# Patient Record
Sex: Male | Born: 1970 | Race: Black or African American | Hispanic: No | Smoking: Never smoker
Health system: Southern US, Community
[De-identification: ages and names within clinical notes are randomized; demographics above are authoritative.]

## PROBLEM LIST (undated history)

## (undated) DIAGNOSIS — I10 Essential (primary) hypertension: Secondary | ICD-10-CM

## (undated) DIAGNOSIS — K219 Gastro-esophageal reflux disease without esophagitis: Secondary | ICD-10-CM

## (undated) DIAGNOSIS — E785 Hyperlipidemia, unspecified: Secondary | ICD-10-CM

## (undated) DIAGNOSIS — E669 Obesity, unspecified: Secondary | ICD-10-CM

## (undated) DIAGNOSIS — I214 Non-ST elevation (NSTEMI) myocardial infarction: Secondary | ICD-10-CM

## (undated) HISTORY — PX: CARDIAC CATHETERIZATION: SHX172

---

## 2006-02-18 ENCOUNTER — Emergency Department: Payer: Self-pay | Admitting: Internal Medicine

## 2009-06-06 ENCOUNTER — Emergency Department: Payer: Self-pay | Admitting: Emergency Medicine

## 2011-02-19 ENCOUNTER — Ambulatory Visit: Payer: Self-pay | Admitting: Unknown Physician Specialty

## 2013-01-16 ENCOUNTER — Emergency Department: Payer: Self-pay | Admitting: Emergency Medicine

## 2014-05-16 ENCOUNTER — Ambulatory Visit: Payer: Self-pay | Admitting: Internal Medicine

## 2015-01-15 ENCOUNTER — Inpatient Hospital Stay: Payer: Self-pay | Admitting: Internal Medicine

## 2015-03-11 ENCOUNTER — Encounter
Admit: 2015-03-11 | Disposition: A | Discharge status: Home / Self Care | Payer: Self-pay | Attending: Internal Medicine | Admitting: Internal Medicine

## 2015-03-30 NOTE — H&P (Signed)
PATIENT NAME:  Carlos BurrowRICHMOND, Carlos Bryant MR#:  [id number] DATE OF BIRTH:  [birth year]  DATE OF ADMISSION:  01/15/2015  PRIMARY CARE PHYSICIAN: [name], MD with [facility].  CHIEF COMPLAINT: Chest pain.   HISTORY OF PRESENTING ILLNESS: A 44 year old African American male patient with history of hypertension, presents to the Emergency Room complaining of acute onset of chest pain, radiating to the back earlier today. The patient was initially seen with an EKG by EMS, thought to have NSTEMI, brought to the Emergency Room. Here, he did have mild elevation in lead III and aVF. Dr. Darrold JunkerParaschos with cardiology was called urgently, who thought the patient is having an NON- STEMI not an ST elevation MI, and is being admitted to the hospitalist service, being critically ill with MI.   He mentions that this pain started slowly 2 days prior, acutely worsened today morning. He has had some nausea, sweating, shortness of breath, but no vomiting, abdominal pain. Never had similar pain, never had a stress test. He mentions he saw his primary care physician about 2 months prior, had a new medication added, and his blood pressure was elevated at that time. Also, his blood pressure is increased today in the range of 170s to 180s systolic.   PAST MEDICAL HISTORY: Hypertension.   FAMILY HISTORY: Kidney transplant in his mother. Premature CAD in his sister, who died of an MI in her 9040s.   SOCIAL HISTORY: The patient does not smoke. Occasional alcohol use. Works for [employer]. No illicit drug use.   ALLERGIES: No known drug allergies.   REVIEW OF SYSTEMS: CONSTITUTIONAL: No fever, fatigue.  EYES: No blurred vision, pain or redness.  EARS, NOSE, AND THROAT: No tinnitus, ear pain, or hearing loss.  RESPIRATORY: No cough, wheeze, hemoptysis.  CARDIOVASCULAR: Has chest pain.  GASTROINTESTINAL: No nausea, vomiting, diarrhea, abdominal pain.  GENITOURINARY: No dysuria, hematuria, or frequency.   ENDOCRINE: No polyuria, nocturia, thyroid problems.  HEMATOLOGIC AND LYMPHATIC: No anemia, easy bruising, bleeding.  INTEGUMENTARY: No acne, rash, lesion.  MUSCULOSKELETAL: No back pain or arthritis.  NEUROLOGIC: No focal numbness, weakness.  PSYCHIATRIC: No anxiety or depression.   HOME MEDICATIONS: 1. Amlodipine 10 mg daily.  2. Losartan 100 mg daily.  3. Multivitamin 1 tablet daily.  4. Hydrochlorothiazide 25 mg oral once a day.   PHYSICAL EXAMINATION: VITAL SIGNS: Temperature 97.9, pulse of 76, blood pressure 170/113, saturating 100% on oxygen.  GENERAL: Obese African American male patient lying in bed, in mild distress secondary to his chest pain.  PSYCHIATRIC: Alert and oriented x 3. Mood and affect appropriate. Judgment intact.  HEENT: Normocephalic, atraumatic. Oral mucosa moist and pink. External ears and nose normal. No pallor or icterus. Pupils bilaterally equal and reactive to light.  NECK: Supple. No thyromegaly. No palpable lymph nodes. Trachea midline. No bruits or JVD.  CARDIOVASCULAR: S1, S2, without any murmurs. Peripheral pulses 2+. No edema.  RESPIRATORY: Normal work of breathing. Clear to auscultation on both sides.  GASTROINTESTINAL: Soft abdomen, nontender. Bowel sounds present. No hepatosplenomegaly palpable.  SKIN: Warm and dry. No petechiae, rash, ulcers.  MUSCULOSKELETAL: No joint swelling, redness, effusion of the large joints. Normal muscle tone.  NEUROLOGICAL: Motor strength 5/5 in upper and lower extremities. Sensation  intact all over.  LYMPHATICS: No cervical lymphadenopathy.   LABORATORY STUDIES AND IMAGING STUDIES: CTA of the chest showed no dissection, no pulmonary embolism. Glucose of 113, BUN 11, creatinine 1.30, sodium 140, potassium 3.7. AST, ALT, alkaline phosphatase, and bilirubin normal.  CK 348. Troponin 0.09. WBC 5.9, hemoglobin 15, platelets of 248,000, MCV 83. INR 0.9. EKG shows normal sinus rhythm with ST elevation in aVF and lead III with  mild depression in aVL area.   ASSESSMENT AND PLAN: 1. Non ST-segment myocardial infarction. The patient with risk factors with uncontrolled hypertension and premature coronary artery disease in the family. Presently, the patient has been seen by cardiology. We will admit the patient on to be telemetry floor, get 2 more sets of cardiac enzymes. Place on nitroglycerin paste. His chest pain is almost resolved at this point. We will get an echocardiogram, and the patient will need a cardiac catheterization per cardiology. We will also had aspirin, statin. We will add metoprolol 50 b.i.d., which will help with his coronary artery disease, and uncontrolled blood pressure.  2. Uncontrolled hypertension. Continue home medications. Add metoprolol b.i.d.  3. Deep vein thrombosis prophylaxis. The patient is on a heparin drip.   CODE STATUS: Full code.   CRITICAL CARE TIME SPENT ON THIS PATIENT: 40 minutes.    ____________________________ Molinda Bailiff Zoha Spranger, MD srs:mw D: 01/15/2015 14:05:53 ET T: 01/15/2015 15:17:35 ET JOB#: [id number]  cc: Wardell Heath R. Manan Olmo, MD, <Dictator> John B. Danne Harbor, MD Wardell Heath [place] B[place] MD ELECTRONICALLY SIGNED 01/16/2015 16:28

## 2015-03-30 NOTE — Consult Note (Signed)
PATIENT NAME:  Carlos Bryant, Carlos Bryant MR#:  [id number] DATE OF BIRTH:  1971/05/28  DATE OF CONSULTATION:  01/15/2015  REFERRING PHYSICIAN:   CONSULTING PHYSICIAN:  Marcina MillardAlexander Mareesa Gathright, MD  PRIMARY CARE PHYSICIAN: [name], MD   CHIEF COMPLAINT: Chest pain.   HISTORY OF PRESENT ILLNESS: The patient is a 44 year old gentleman with history of hypertension. The patient reports that he was in his usual state of health until 1-2 days prior to admission, when he has had intermittent episodes of chest discomfort. The patient reports substernal chest discomfort, particularly when takes a deep breath in, which radiates to his mid back. The patient was brought in via EMS to [facility]. EKG did reveal ST elevation in inferior leads and STEMI protocol was called. After arrival to the Emergency Room, chest pain syndrome improved to 2 to 3/10. Review of the EKG showed the EKG was nondiagnostic for ST elevation myocardial infarction. Initial troponin was borderline at 0.09. CT scan of the chest was performed, which did not reveal evidence for pulmonary embolus. The patient was hypertensive with a blood pressure 170/100.   PAST MEDICAL HISTORY: Hypertension.   MEDICATIONS: Amlodipine 10 mg daily, losartan 100 mg daily, hydrochlorothiazide 25 mg daily, multivitamin 1 daily.  SOCIAL HISTORY: The patient is married. He denies tobacco abuse.  FAMILY HISTORY: No immediate family history of coronary artery disease or myocardial infarction.   REVIEW OF SYSTEMS:  CONSTITUTIONAL: No fever or chills.  EYES: No blurry vision.  EARS: No hearing loss.  RESPIRATORY: No shortness of breath.  CARDIOVASCULAR: The patient does complain of chest pain, which radiates to his mid back, particularly upon deep breaths.  GASTROINTESTINAL: No nausea, vomiting, or diarrhea.  GENITOURINARY: No dysuria or hematuria.  ENDOCRINE: No polyuria or polydipsia.  MUSCULOSKELETAL: No arthralgias or myalgias.  NEUROLOGICAL: No  focal muscle weakness or numbness.  PSYCHOLOGICAL: No depression or anxiety.   PHYSICAL EXAMINATION: VITAL SIGNS: Blood pressure was 170/100, pulse is 70.  HEENT: Pupils equal, reactive to light and accommodation.  NECK: Supple without thyromegaly.  LUNGS: Clear.  CARDIOVASCULAR: Normal JVP. Normal PMI. Regular rate and rhythm. Normal S1, S2. No appreciable gallop, murmur, or rub.  ABDOMEN: Soft and nontender. Pulses were intact bilaterally.  MUSCULOSKELETAL: Normal muscle tone.  NEUROLOGIC: The patient is alert and oriented x 3. Motor and sensory both grossly intact.   IMPRESSION: A 44 year old gentleman who presents with 1 to 2-day history of intermittent chest discomfort. Electrocardiogram shows nondiagnostic ST elevation in inferior leads, chest pain with atypical features which has improved following admission. CT scan did not reveal evidence for pulmonary embolus or aortic dissection.   RECOMMENDATIONS: 1. Agree with overall current therapy.  2. Continue heparin drip for now.  3. Continue to cycle cardiac enzymes.  4. Add metoprolol for blood pressure control.  5. Review 2-D echocardiogram.  6. Further recommendations pending the patient's initial clinical course and echocardiogram results.    ____________________________ Marcina MillardAlexander Lasheba Stevens, MD ap:mw D: 01/15/2015 16:45:44 ET T: 01/15/2015 17:06:04 ET JOB#: [id number]  cc: Marcina MillardAlexander Michaell Grider, MD, <Dictator> Marcina MillardALEXANDER Koda Defrank MD ELECTRONICALLY SIGNED 01/21/2015 14:37

## 2015-03-31 ENCOUNTER — Encounter: Payer: BLUE CROSS/BLUE SHIELD | Attending: Internal Medicine | Admitting: *Deleted

## 2015-03-31 DIAGNOSIS — I214 Non-ST elevation (NSTEMI) myocardial infarction: Secondary | ICD-10-CM | POA: Diagnosis present

## 2015-03-31 NOTE — Progress Notes (Signed)
Daily Session Note  Patient Details  Name: Carlos Bryant MRN: [id number] Date of Birth: March 21, 1971 Referring Provider:  Dionne Ano, MD  Encounter Date: 03/31/2015  Check In:     Session Check In - 03/31/15 1027    Check-In   Staff Present Heath Lark RN, BSN, CCRP; Earlean Shawl BS, ACSM CEP, Exercise Physiologist; Candiss Norse MS, ACSM CEP, Exercise Physiologist   ER physicians immediately available to respond to emergencies See telemetry face sheet for immediately available ER MD   Warm-up and Cool-down Performed on first and last piece of equipment   VAD Patient? No   Pain Assessment   Currently in Pain? Yes   Pain Score 3    Pain Location Chest   Pain Descriptors / Indicators Discomfort   Pain Type Other (Comment)   Pain Onset In the past 7 days   Pain Frequency Intermittent   Aggravating Factors  activity   Pain Relieving Factors stopping activity   Multiple Pain Sites No         Goals Met:  Proper associated with RPD/PD & O2 Sat Independence with exercise equipment  Goals Unmet:  RPE BP Paymon presented with a high diastolic BP which continued to climb with exercise. Discontinued Treadmill due to high blood pressure and chest pain 3/10. Kinte finished exercise in recumbent position on XR. Diastolic BP stayed elevated at discharge. MD will be notified and exercise prescriptipon adjusted as needed.   Goals Comments:    [name] is Medical Director for [place] and [facility].

## 2015-04-02 ENCOUNTER — Encounter: Payer: BLUE CROSS/BLUE SHIELD | Admitting: *Deleted

## 2015-04-02 DIAGNOSIS — I214 Non-ST elevation (NSTEMI) myocardial infarction: Secondary | ICD-10-CM

## 2015-04-02 NOTE — Progress Notes (Signed)
Daily Session Note  Patient Details  Name: Carlos Bryant MRN: [id number] Date of Birth: Feb 08, 1971 Referring Provider:  Dionne Ano, MD  Encounter Date: 04/02/2015  Check In:     Session Check In - 04/02/15 0935    Check-In   Staff Present Carlos Lark RN, BSN, CCRP; Carlos Bryant BS, ACSM EP-C, Exercise Physiologist; Carlos Norse MS, ACSM CEP, Exercise Physiologist   ER physicians immediately available to respond to emergencies See telemetry face sheet for immediately available ER MD   Warm-up and Cool-down Performed on first and last piece of equipment   VAD Patient? No   Pain Assessment   Currently in Pain? No/denies         Goals Met:   Tolerated exercise well. Pulse, NSR.     Goals Unmet:  BP  Goals Comments: Carlos Simpson, RN waiting to hear follow up from Dr. Posey Bryant about his blood pressure.   Dr. Emily Bryant is Medical Director for [place] and [facility].

## 2015-04-04 ENCOUNTER — Telehealth: Payer: Self-pay | Admitting: *Deleted

## 2015-04-04 ENCOUNTER — Encounter: Payer: BLUE CROSS/BLUE SHIELD | Admitting: *Deleted

## 2015-04-04 VITALS — BP 160/118

## 2015-04-04 DIAGNOSIS — I214 Non-ST elevation (NSTEMI) myocardial infarction: Secondary | ICD-10-CM

## 2015-04-04 NOTE — Telephone Encounter (Signed)
Dr. Eliane DecreePatel's office called me back and will call [facility].

## 2015-04-04 NOTE — Progress Notes (Signed)
Daily Session Note  Patient Details  Name: Carlos Bryant MRN: [id number] Date of Birth: December 11, 1970 Referring Provider:  Dionne Ano, MD  Encounter Date: 04/04/2015  Check In:     Session Check In - 04/04/15 0923    Check-In   Staff Present Heath Lark RN, BSN, CCRP;Aanyah Loa RN, BSN;Renee Dillard Essex MS, ACSM CEP Exercise Physiologist   Warm-up and Cool-down Performed on first and last piece of equipment   VAD Patient? No   Pain Assessment   Currently in Pain? No/denies         Goals Met:  Proper associated with RPD/PD & O2 Sat Exercise tolerated well  Goals Unmet:  Not Applicable  Goals Comments:  [name] is Medical Director for [place] and [facility].

## 2015-04-07 ENCOUNTER — Encounter: Payer: BLUE CROSS/BLUE SHIELD | Admitting: *Deleted

## 2015-04-07 DIAGNOSIS — I214 Non-ST elevation (NSTEMI) myocardial infarction: Secondary | ICD-10-CM

## 2015-04-07 NOTE — Progress Notes (Signed)
Daily Session Note  Patient Details  Name: Carlos Bryant MRN: [id number] Date of Birth: 24-Sep-1971 Referring Provider:  Dionne Ano, MD  Encounter Date: 04/07/2015  Check In:     Session Check In - 04/07/15 1225    Check-In   Staff Present Candiss Norse MS, ACSM CEP Exercise Physiologist;Kelly Alfonso Patten, ACSM CEP Exercise Physiologist;Susanne Bice RN, BSN, Watson   ER physicians immediately available to respond to emergencies See telemetry face sheet for immediately available ER MD   Warm-up and Cool-down Performed on first and last piece of equipment   VAD Patient? No   Pain Assessment   Currently in Pain? No/denies   Multiple Pain Sites No         Goals Met:  Independence with exercise equipment Personal goals reviewed  Goals Unmet:  RPE BP Red had chest pain while walking on the treadmill, he reduced his speed and the symptoms went away  Goals Comments: Staff continue to work with Red on trying to correct his high blood pressure and the chest pain he gets with exertion. He was having chest pain today while walking, but the symptoms were relieved when he slowed down.    [name] is Medical Director for [place] and [facility].

## 2015-04-11 ENCOUNTER — Encounter: Payer: BLUE CROSS/BLUE SHIELD | Admitting: *Deleted

## 2015-04-11 DIAGNOSIS — I214 Non-ST elevation (NSTEMI) myocardial infarction: Secondary | ICD-10-CM

## 2015-04-11 NOTE — Progress Notes (Signed)
Daily Session Note  Patient Details  Name: JADRIAN BULMAN MRN: [id number] Date of Birth: [birth year] Referring Provider:  Dionne Ano, MD  Encounter Date: 04/11/2015  Check In:     Session Check In - 04/11/15 0917    Check-In   Staff Present Heath Lark RN, BSN, CCRP;Renee Dillard Essex MS, ACSM CEP Exercise Physiologist;Carroll Enterkin RN, BSN   ER physicians immediately available to respond to emergencies See telemetry face sheet for immediately available ER MD   Medication changes reported     No   Fall or balance concerns reported    No   Warm-up and Cool-down Performed on first and last piece of equipment   VAD Patient? No   Pain Assessment   Currently in Pain? No/denies         Goals Met:  Independence with exercise equipment Exercise tolerated well No report of cardiac concerns or symptoms Strength training completed today  Goals Unmet:  Not Applicable  Goals Comments: Has appointment with MD next week. for BP follow up.    [name] is Medical Director for [place] and [facility].

## 2015-04-14 ENCOUNTER — Encounter: Payer: BLUE CROSS/BLUE SHIELD | Admitting: *Deleted

## 2015-04-14 DIAGNOSIS — I214 Non-ST elevation (NSTEMI) myocardial infarction: Secondary | ICD-10-CM

## 2015-04-14 NOTE — Progress Notes (Signed)
Daily Session Note  Patient Details  Name: ANTAVIUS SPERBECK MRN: [id number] Date of Birth: 1971/08/19 Referring Provider:  Dionne Ano, MD  Encounter Date: 04/14/2015  Check In:     Session Check In - 04/14/15 0902    Check-In   Staff Present Heath Lark RN, BSN, CCRP;Kelly Hayes BS, ACSM CEP Exercise Physiologist;Mystique Bjelland Dillard Essex MS, ACSM CEP Exercise Physiologist   ER physicians immediately available to respond to emergencies See telemetry face sheet for immediately available ER MD   Medication changes reported     No   Fall or balance concerns reported    No   Warm-up and Cool-down Performed on first and last piece of equipment   VAD Patient? No   Pain Assessment   Currently in Pain? No/denies   Multiple Pain Sites No         Goals Met:  Independence with exercise equipment Exercise tolerated well Personal goals reviewed No report of cardiac concerns or symptoms  Goals Unmet:  BP  Goals Comments: Resting blood pressure continues to be elevated.   [name] is Medical Director for [place] and [facility].

## 2015-04-18 ENCOUNTER — Encounter: Payer: BLUE CROSS/BLUE SHIELD | Admitting: *Deleted

## 2015-04-18 VITALS — Ht 72.0 in | Wt 273.2 lb

## 2015-04-18 DIAGNOSIS — I214 Non-ST elevation (NSTEMI) myocardial infarction: Secondary | ICD-10-CM

## 2015-04-18 NOTE — Progress Notes (Signed)
Daily Session Note  Patient Details  Name: DYLEN MCELHANNON MRN: [id number] Date of Birth: February 12, 1971 Referring Provider:  Dionne Ano, MD  Encounter Date: 04/18/2015  Check In:     Session Check In - 04/18/15 0935    Check-In   Staff Present Heath Lark RN, BSN, CCRP;Renee Dillard Essex MS, ACSM CEP Exercise Physiologist;Carroll Enterkin RN, BSN   ER physicians immediately available to respond to emergencies See telemetry face sheet for immediately available ER MD   Medication changes reported     No   Fall or balance concerns reported    No   Warm-up and Cool-down Performed on first and last piece of equipment   VAD Patient? No   Pain Assessment   Currently in Pain? No/denies         Goals Met:  Independence with exercise equipment Exercise tolerated well Personal goals reviewed No report of cardiac concerns or symptoms Strength training completed today  Goals Unmet:  Not Applicable  Goals Comments:     [name] is Medical Director for [place] and [facility].

## 2015-04-20 ENCOUNTER — Encounter: Payer: Self-pay | Admitting: *Deleted

## 2015-04-20 NOTE — Progress Notes (Signed)
Input data from previous EMR to update the Individualized Treatment Plan. 

## 2015-04-21 ENCOUNTER — Encounter: Payer: BLUE CROSS/BLUE SHIELD | Admitting: *Deleted

## 2015-04-21 DIAGNOSIS — I214 Non-ST elevation (NSTEMI) myocardial infarction: Secondary | ICD-10-CM

## 2015-04-21 NOTE — Progress Notes (Signed)
Daily Session Note  Patient Details  Name: Carlos Bryant MRN: [id number] Date of Birth: [birth year] Referring Provider:  Dionne Ano, MD  Encounter Date: 04/21/2015  Check In:     Session Check In - 04/21/15 0835    Check-In   Staff Present Earlean Shawl BS, ACSM CEP Exercise Physiologist;Renee Dillard Essex MS, ACSM CEP Exercise Physiologist;Susanne Bice RN, BSN, CCRP   ER physicians immediately available to respond to emergencies See telemetry face sheet for immediately available ER MD   Medication changes reported     No   Fall or balance concerns reported    No   Warm-up and Cool-down Performed on first and last piece of equipment   VAD Patient? No   Pain Assessment   Currently in Pain? No/denies   Multiple Pain Sites No         Goals Met:  Independence with exercise equipment Exercise tolerated well No report of cardiac concerns or symptoms  Goals Unmet:  Not Applicable  Goals Comments:    [name] is Medical Director for [place] and [facility].

## 2015-04-22 ENCOUNTER — Encounter: Payer: Self-pay | Admitting: *Deleted

## 2015-04-22 NOTE — Progress Notes (Signed)
Cardiac Individual Treatment Plan  Patient Details  Name: Carlos Bryant MRN: [id number] Date of Birth: [birth year] Referring Provider:  Dr. Carolanne Grumbling Diagnosis NSTEMI   PCI Initial Encounter Date:    Patient's Home Medications on Admission:  Current outpatient prescriptions:  .  aspirin EC 81 MG tablet, Take by mouth., Disp: , Rfl:  .  atorvastatin (LIPITOR) 80 MG tablet, Take by mouth., Disp: , Rfl:  .  losartan (COZAAR) 100 MG tablet, Take by mouth., Disp: , Rfl:  .  metoprolol succinate (TOPROL-XL) 25 MG 24 hr tablet, Take 50 mg by mouth. , Disp: , Rfl:  .  Multiple Vitamin (MULTI-VITAMINS) TABS, Take by mouth., Disp: , Rfl:  .  nitroGLYCERIN (NITROSTAT) 0.4 MG SL tablet, Place under the tongue., Disp: , Rfl:  .  ticagrelor (BRILINTA) 90 MG TABS tablet, Take by mouth 2 (two) times daily., Disp: , Rfl:   Past Medical History: No past medical history on file.  Tobacco Use: History  Smoking status  . Not on file  Smokeless tobacco  . Not on file    Labs: Recent Review Flowsheet Data    There is no flowsheet data to display.       Exercise Target Goals:    Exercise Program Goal: Individual exercise prescription set with THRR, safety & activity barriers. Participant demonstrates ability to understand and report RPE using BORG scale, to self-measure pulse accurately, and to acknowledge the importance of the exercise prescription.  Exercise Prescription Goal: Starting with aerobic activity 30 plus minutes a day, 3 days per week for initial exercise prescription. Provide home exercise prescription and guidelines that participant acknowledges understanding prior to discharge.  Activity Barriers & Risk Stratification:     Activity Barriers & Risk Stratification - 04/20/15 1138    Activity Barriers & Risk Stratification   Activity Barriers None   Risk Stratification High      6 Minute Walk:     6 Minute Walk      03/11/15 1139       6 Minute Walk   Phase  Initial     Distance 1240 feet     Walk Time 6 minutes     Resting HR 75 bpm     Resting BP 157/103 mmHg     Max Ex. HR 113 bpm     Max Ex. BP 160/106 mmHg     RPE 11     Symptoms No        Initial Exercise Prescription:   Exercise Prescription Changes:     Exercise Prescription Changes      04/22/15 0700           Exercise Review   Progression Yes       Response to Exercise   Blood Pressure (Admit) 158/118 mmHg       Blood Pressure (Exercise) 180/110 mmHg       Blood Pressure (Exit) 150/90 mmHg       Heart Rate (Admit) 83 bpm       Heart Rate (Exercise) 107 bpm       Heart Rate (Exit) 65 bpm       Rating of Perceived Exertion (Exercise) 12       Duration Progress to 30 minutes of continuous aerobic without signs/symptoms of physical distress       Intensity THRR unchanged  Exercise below the threshold that illicits symptoms        Progression Continue progressive overload as per policy without signs/symptoms or  physical distress.       Resistance Training   Training Prescription Yes       Weight 4       Reps 10-12       Treadmill   MPH 3       Grade 0       Minutes 20       REL-XR   Level 7       Watts 70       Minutes 20          Discharge Exercise Prescription:   Nutrition:  Target Goals: Understanding of nutrition guidelines, daily intake of sodium 1500mg , cholesterol 200mg , calories 30% from fat and 7% or less from saturated fats, daily to have 5 or more servings of fruits and vegetables.  Biometrics:     Pre Biometrics - 04/18/15 1111    Pre Biometrics   Height 6' (1.829 m)   Weight 273 lb 3.2 oz (123.923 kg)   BMI (Calculated) 37.1       Nutrition Therapy Plan and Nutrition Goals:     Nutrition Therapy & Goals - 04/18/15 1109    Nutrition Therapy   Diet 2000 calorie meal plan; DASH diet principles   Drug/Food Interactions Statins/Certain Fruits   Fiber 30 grams   Whole Grain Foods 3 servings   Protein 9 ounces/day   Saturated  Fats 13 max. grams   Fruits and Vegetables 5 servings/day   Personal Nutrition Goals   Personal Goal #1 Increase intake of fruits/vegetables with goal of at least 5 servings daily.   Personal Goal #2 To continue to read labels for saturated fat, trans fat and sodium.      Nutrition Discharge: Rate Your Plate Scores:     Rate Your Plate - 14/78/29 1112    Rate Your Plate Scores   Pre Score 73   Pre Score % 81 %      Nutrition Goals Re-Evaluation:   Psychosocial: Target Goals: Acknowledge presence or absence of depression, maximize coping skills, provide positive support system. Participant is able to verbalize types and ability to use techniques and skills needed for reducing stress and depression.  Initial Review & Psychosocial Screening:   Quality of Life Scores:     Quality of Life - 03/11/15 1141    Quality of Life Scores   Health/Function Pre 28.87 %   Socioeconomic Pre 28.75 %   Psych/Spiritual Pre 30 %   Family Pre 26.4 %   GLOBAL Pre 28.71 %      PHQ-9:     Recent Review Flowsheet Data    There is no flowsheet data to display.      Psychosocial Evaluation and Intervention:   Psychosocial Re-Evaluation:   Vocational Rehabilitation: Provide vocational rehab assistance to qualifying candidates.   Vocational Rehab Evaluation & Intervention:     Vocational Rehab - 04/20/15 1138    Initial Vocational Rehab Evaluation & Intervention   Assessment shows need for Vocational Rehabilitation No      Education: Education Goals: Education classes will be provided on a weekly basis, covering required topics. Participant will state understanding/return demonstration of topics presented.  Learning Barriers/Preferences:     Learning Barriers/Preferences - 04/20/15 1138    Learning Barriers/Preferences   Learning Barriers None   Learning Preferences None      Education Topics: General Nutrition Guidelines/Fats and Fiber: -Group instruction provided  by verbal, written material, models and posters to present the general guidelines for heart healthy nutrition.  Gives an explanation and review of dietary fats and fiber.   Controlling Sodium/Reading Food Labels: -Group verbal and written material supporting the discussion of sodium use in heart healthy nutrition. Review and explanation with models, verbal and written materials for utilization of the food label.   Exercise Physiology & Risk Factors: - Group verbal and written instruction with models to review the exercise physiology of the cardiovascular system and associated critical values. Details cardiovascular disease risk factors and the goals associated with each risk factor.   Aerobic Exercise & Resistance Training: - Gives group verbal and written discussion on the health impact of inactivity. On the components of aerobic and resistive training programs and the benefits of this training and how to safely progress through these programs.   Flexibility, Balance, General Exercise Guidelines: - Provides group verbal and written instruction on the benefits of flexibility and balance training programs. Provides general exercise guidelines with specific guidelines to those with heart or lung disease. Demonstration and skill practice provided.   Stress Management: - Provides group verbal and written instruction about the health risks of elevated stress, cause of high stress, and healthy ways to reduce stress.   Depression: - Provides group verbal and written instruction on the correlation between heart/lung disease and depressed mood, treatment options, and the stigmas associated with seeking treatment.   Anatomy & Physiology of the Heart: - Group verbal and written instruction and models provide basic cardiac anatomy and physiology, with the coronary electrical and arterial systems. Review of: AMI, Angina, Valve disease, Heart Failure, Cardiac Arrhythmia, Pacemakers, and the  ICD.   Cardiac Procedures: - Group verbal and written instruction and models to describe the testing methods done to diagnose heart disease. Reviews the outcomes of the test results. Describes the treatment choices: Medical Management, Angioplasty, or Coronary Bypass Surgery.   Cardiac Medications: - Group verbal and written instruction to review commonly prescribed medications for heart disease. Reviews the medication, class of the drug, and side effects. Includes the steps to properly store meds and maintain the prescription regimen.   Go Sex-Intimacy & Heart Disease, Get SMART - Goal Setting: - Group verbal and written instruction through game format to discuss heart disease and the return to sexual intimacy. Provides group verbal and written material to discuss and apply goal setting through the application of the S.M.A.R.T. Method.   Other Matters of the Heart: - Provides group verbal, written materials and models to describe Heart Failure, Angina, Valve Disease, and Diabetes in the realm of heart disease. Includes description of the disease process and treatment options available to the cardiac patient.   Exercise & Equipment Safety: - Individual verbal instruction and demonstration of equipment use and safety with use of the equipment.   Infection Prevention: - Provides verbal and written material to individual with discussion of infection control including proper hand washing and proper equipment cleaning during exercise session.   Falls Prevention: - Provides verbal and written material to individual with discussion of falls prevention and safety.   Diabetes: - Individual verbal and written instruction to review signs/symptoms of diabetes, desired ranges of glucose level fasting, after meals and with exercise. Advice that pre and post exercise glucose checks will be done for 3 sessions at entry of program.    Knowledge Questionnaire Score:     Knowledge Questionnaire  Score - 03/11/15 1138    Knowledge Questionnaire Score   Pre Score 20/28      Personal Goals and Risk Factors at Admission:  Personal Goals and Risk Factors at Admission - 04/20/15 1140    Personal Goals and Risk Factors on Admission   Diabetes No   Hypertension Yes   Goal Participant will see blood pressure controlled within the values of 140/63mm/Hg or within value directed by their physician.   Intervention Provide nutrition & aerobic exercise along with prescribed medications to achieve BP 140/90 or less.   Lipids Yes   Goal Cholesterol controlled with medications as prescribed, with individualized exercise RX and with personalized nutrition plan. Value goals: LDL < , HDL > . Participant states understanding of desired cholesterol values and following prescriptions.      Personal Goals and Risk Factors Review:      Goals and Risk Factor Review      04/18/15 0935 04/22/15 1317         Weight Management   Goals Progress/Improvement seen No       Comments HAs not seen any changes yet.  To see RD today.       Increase Aerobic Exercise and Physical Activity   Goals Progress/Improvement seen  Yes       Take Less Medication   Goals Progress/Improvement seen No       Comments MD is working on BP control. Expecting medication changes until BP under control       Understand more about Heart/Pulmonary Disease   Goals Progress/Improvement seen  Yes       Comments Attending classes, no questions.       Hypertension   Goal  --  Continues to work closely with MD to gain control  of hypertension      Abnormal Lipids   Goal  --  No labs to see changes         Personal Goals Discharge:     Comments: 30 day review

## 2015-04-24 ENCOUNTER — Other Ambulatory Visit: Payer: Self-pay | Admitting: *Deleted

## 2015-04-24 DIAGNOSIS — I214 Non-ST elevation (NSTEMI) myocardial infarction: Secondary | ICD-10-CM

## 2015-04-25 ENCOUNTER — Encounter: Payer: BLUE CROSS/BLUE SHIELD | Admitting: *Deleted

## 2015-04-25 DIAGNOSIS — I214 Non-ST elevation (NSTEMI) myocardial infarction: Secondary | ICD-10-CM | POA: Diagnosis not present

## 2015-04-25 NOTE — Progress Notes (Signed)
Daily Session Note  Patient Details  Name: Carlos Bryant MRN: [id number] Date of Birth: [birth year] Referring Provider:  Dionne Ano, MD  Encounter Date: 04/25/2015  Check In:     Session Check In - 04/25/15 0928    Check-In   Staff Present Heath Lark RN, BSN, CCRP;Renee Dillard Essex MS, ACSM CEP Exercise Physiologist;Carroll Enterkin RN, BSN   ER physicians immediately available to respond to emergencies See telemetry face sheet for immediately available ER MD   Medication changes reported     No   Fall or balance concerns reported    No   Warm-up and Cool-down Performed on first and last piece of equipment   VAD Patient? No   Pain Assessment   Currently in Pain? No/denies         Goals Met:  Independence with exercise equipment Exercise tolerated well No report of cardiac concerns or symptoms Strength training completed today  Goals Unmet:  Not Applicable  Goals Comments:    [name] is Medical Director for [place] and [facility].

## 2015-04-30 ENCOUNTER — Encounter: Payer: BLUE CROSS/BLUE SHIELD | Attending: Internal Medicine

## 2015-04-30 DIAGNOSIS — I214 Non-ST elevation (NSTEMI) myocardial infarction: Secondary | ICD-10-CM | POA: Insufficient documentation

## 2015-04-30 NOTE — Progress Notes (Signed)
Daily Session Note  Patient Details  Name: Carlos Bryant MRN: [id number] Date of Birth: 1971/07/09 Referring Provider:  Dionne Ano, MD  Encounter Date: 04/30/2015  Check In:     Session Check In - 04/30/15 0843    Check-In   Staff Present Heath Lark RN, BSN, CCRP;Reverie Vaquera BS, ACSM EP-C, Exercise Physiologist;Renee Dillard Essex MS, ACSM CEP Exercise Physiologist   ER physicians immediately available to respond to emergencies See telemetry face sheet for immediately available ER MD   Medication changes reported     No   Fall or balance concerns reported    No   Warm-up and Cool-down Performed on first and last piece of equipment   VAD Patient? No   Pain Assessment   Currently in Pain? No/denies         Goals Met:  Proper associated with RPD/PD & O2 Sat Exercise tolerated well No report of cardiac concerns or symptoms Strength training completed today  Goals Unmet:  Not Applicable  Goals Comments:    [name] is Medical Director for [place] and [facility].

## 2015-05-02 ENCOUNTER — Encounter: Payer: BLUE CROSS/BLUE SHIELD | Admitting: *Deleted

## 2015-05-02 DIAGNOSIS — I214 Non-ST elevation (NSTEMI) myocardial infarction: Secondary | ICD-10-CM

## 2015-05-02 NOTE — Progress Notes (Signed)
Daily Session Note  Patient Details  Name: Carlos Bryant MRN: [id number] Date of Birth: [birth year] Referring Provider:  Dionne Ano, MD  Encounter Date: 05/02/2015  Check In:      Goals Met:  Independence with exercise equipment Exercise tolerated well No report of cardiac concerns or symptoms Strength training completed today  Goals Unmet:  Not Applicable  Goals Comments: Reviewed BP progress   [name] is Medical Director for [place] and [facility].

## 2015-05-05 ENCOUNTER — Encounter: Payer: BLUE CROSS/BLUE SHIELD | Admitting: *Deleted

## 2015-05-05 DIAGNOSIS — I214 Non-ST elevation (NSTEMI) myocardial infarction: Secondary | ICD-10-CM | POA: Diagnosis not present

## 2015-05-05 NOTE — Progress Notes (Signed)
Daily Session Note  Patient Details  Name: Carlos Bryant MRN: [id number] Date of Birth: July 08, 1971 Referring Provider:  Dionne Ano, MD  Encounter Date: 05/05/2015  Check In:     Session Check In - 05/05/15 0820    Check-In   Staff Present Candiss Norse MS, ACSM CEP Exercise Physiologist;Susanne Bice RN, BSN, CCRP;Javana Schey Alfonso Patten, ACSM CEP Exercise Physiologist   ER physicians immediately available to respond to emergencies See telemetry face sheet for immediately available ER MD   Medication changes reported     No   Fall or balance concerns reported    No   Warm-up and Cool-down Performed on first and last piece of equipment   VAD Patient? No   Pain Assessment   Currently in Pain? Yes   Pain Score 3    Pain Location Chest   Pain Type Chronic pain   Pain Onset Other (comment)  Onset of pain with exercise. Relieved with rest.   Multiple Pain Sites No         Goals Met:  Independence with exercise equipment Exercise tolerated well  Goals Unmet:  Not Applicable  Goals Comments:    [name] is Medical Director for [place] and [facility].

## 2015-05-07 DIAGNOSIS — I214 Non-ST elevation (NSTEMI) myocardial infarction: Secondary | ICD-10-CM

## 2015-05-07 NOTE — Progress Notes (Signed)
Daily Session Note  Patient Details  Name: Carlos Bryant MRN: [id number] Date of Birth: 01-11-1971 Referring Provider:  Dionne Ano, MD  Encounter Date: 05/07/2015  Check In:     Session Check In - 05/07/15 0841    Check-In   Staff Present Heath Lark RN, BSN, CCRP;Trimaine Maser BS, ACSM EP-C, Exercise Physiologist;Renee Dillard Essex MS, ACSM CEP Exercise Physiologist   ER physicians immediately available to respond to emergencies See telemetry face sheet for immediately available ER MD   Medication changes reported     No   Fall or balance concerns reported    No   Warm-up and Cool-down Performed on first and last piece of equipment   VAD Patient? No   Pain Assessment   Currently in Pain? No/denies         Goals Met:  Proper associated with RPD/PD & O2 Sat Exercise tolerated well No report of cardiac concerns or symptoms Strength training completed today  Goals Unmet:  Not Applicable  Goals Comments:    [name] is Medical Director for [place] and [facility].

## 2015-05-12 ENCOUNTER — Encounter: Payer: BLUE CROSS/BLUE SHIELD | Admitting: *Deleted

## 2015-05-12 DIAGNOSIS — I214 Non-ST elevation (NSTEMI) myocardial infarction: Secondary | ICD-10-CM | POA: Diagnosis not present

## 2015-05-12 NOTE — Progress Notes (Signed)
Daily Session Note  Patient Details  Name: Carlos Bryant MRN: [id number] Date of Birth: [birth year] Referring Provider:  Dionne Ano, MD  Encounter Date: 05/12/2015  Check In:     Session Check In - 05/12/15 0840    Check-In   Staff Present Candiss Norse MS, ACSM CEP Exercise Physiologist;Susanne Bice RN, BSN, CCRP;Abran Gavigan Alfonso Patten, ACSM CEP Exercise Physiologist   ER physicians immediately available to respond to emergencies See telemetry face sheet for immediately available ER MD   Medication changes reported     No   Fall or balance concerns reported    No   Warm-up and Cool-down Performed on first and last piece of equipment   VAD Patient? No   Pain Assessment   Currently in Pain? No/denies   Multiple Pain Sites No         Goals Met:  Independence with exercise equipment Exercise tolerated well No report of cardiac concerns or symptoms  Goals Unmet:  Not Applicable  Goals Comments:    [name] is Medical Director for [facility] and [facility].

## 2015-05-14 DIAGNOSIS — I214 Non-ST elevation (NSTEMI) myocardial infarction: Secondary | ICD-10-CM

## 2015-05-14 NOTE — Progress Notes (Signed)
Daily Session Note  Patient Details  Name: NAVRAJ DREIBELBIS MRN: [id number] Date of Birth: [birth year] Referring Provider:  Dionne Ano, MD  Encounter Date: 05/14/2015  Check In:     Session Check In - 05/14/15 0842    Check-In   Staff Present Heath Lark RN, BSN, CCRP;Adaja Wander BS, ACSM EP-C, Exercise Physiologist;Renee Dillard Essex MS, ACSM CEP Exercise Physiologist   ER physicians immediately available to respond to emergencies See telemetry face sheet for immediately available ER MD   Medication changes reported     No   Fall or balance concerns reported    No   Warm-up and Cool-down Performed on first and last piece of equipment   VAD Patient? No   Pain Assessment   Currently in Pain? No/denies         Goals Met:  Proper associated with RPD/PD & O2 Sat Exercise tolerated well No report of cardiac concerns or symptoms Strength training completed today  Goals Unmet:  Not Applicable  Goals Comments:    [name] is Medical Director for [place] and [facility].

## 2015-05-16 ENCOUNTER — Encounter: Payer: Self-pay | Admitting: *Deleted

## 2015-05-16 DIAGNOSIS — I214 Non-ST elevation (NSTEMI) myocardial infarction: Secondary | ICD-10-CM

## 2015-05-19 ENCOUNTER — Encounter: Payer: Self-pay | Admitting: *Deleted

## 2015-05-19 ENCOUNTER — Encounter: Payer: BLUE CROSS/BLUE SHIELD | Admitting: *Deleted

## 2015-05-19 DIAGNOSIS — I214 Non-ST elevation (NSTEMI) myocardial infarction: Secondary | ICD-10-CM

## 2015-05-19 NOTE — Progress Notes (Signed)
Daily Session Note  Patient Details  Name: Carlos Bryant MRN: [id number] Date of Birth: [birth year] Referring Provider:  Dionne Ano, MD  Encounter Date: 05/19/2015  Check In:     Session Check In - 05/19/15 0842    Check-In   Staff Present Heath Lark RN, BSN, CCRP;Analisia Kingsford RN, BSN;Kelly Amedeo Plenty BS, ACSM CEP Exercise Physiologist   ER physicians immediately available to respond to emergencies See telemetry face sheet for immediately available ER MD   Medication changes reported     No   Fall or balance concerns reported    No   Warm-up and Cool-down Performed on first and last piece of equipment   VAD Patient? No   Pain Assessment   Currently in Pain? No/denies         Goals Met:  Proper associated with RPD/PD & O2 Sat Exercise tolerated well  Goals Unmet:  Not Applicable  Goals Comments: Still having some intermittent Chest Pain with exercise. Will discuss with his MD   [name] is Medical Director for [place] and [facility].

## 2015-05-20 ENCOUNTER — Encounter: Payer: Self-pay | Admitting: *Deleted

## 2015-05-20 DIAGNOSIS — I214 Non-ST elevation (NSTEMI) myocardial infarction: Secondary | ICD-10-CM

## 2015-05-20 NOTE — Progress Notes (Signed)
Cardiac Individual Treatment Plan  Patient Details  Name: Carlos Bryant MRN: [id number] Date of Birth: 1971/07/09 Referring Provider:  Dr. Carolanne Grumbling  Initial Encounter Date:  03/11/2015    Visit Diagnosis: Acute non-ST-elevation myocardial infarction  Patient's Home Medications on Admission:  Current outpatient prescriptions:  .  aspirin EC 81 MG tablet, Take by mouth., Disp: , Rfl:  .  atorvastatin (LIPITOR) 80 MG tablet, Take by mouth., Disp: , Rfl:  .  carvedilol (COREG) 25 MG tablet, Take by mouth., Disp: , Rfl:  .  hydrALAZINE (APRESOLINE) 25 MG tablet, Take by mouth., Disp: , Rfl:  .  losartan (COZAAR) 100 MG tablet, Take by mouth., Disp: , Rfl:  .  metoprolol succinate (TOPROL-XL) 25 MG 24 hr tablet, Take 50 mg by mouth. , Disp: , Rfl:  .  Multiple Vitamin (MULTI-VITAMINS) TABS, Take by mouth., Disp: , Rfl:  .  nitroGLYCERIN (NITROSTAT) 0.4 MG SL tablet, Place under the tongue., Disp: , Rfl:  .  ticagrelor (BRILINTA) 90 MG TABS tablet, Take by mouth 2 (two) times daily., Disp: , Rfl:  .  ticagrelor (BRILINTA) 90 MG TABS tablet, Take by mouth., Disp: , Rfl:   Past Medical History: No past medical history on file.  Tobacco Use: History  Smoking status  . Not on file  Smokeless tobacco  . Not on file    Labs: Recent Review Flowsheet Data    There is no flowsheet data to display.       Exercise Target Goals:    Exercise Program Goal: Individual exercise prescription set with THRR, safety & activity barriers. Participant demonstrates ability to understand and report RPE using BORG scale, to self-measure pulse accurately, and to acknowledge the importance of the exercise prescription.  Exercise Prescription Goal: Starting with aerobic activity 30 plus minutes a day, 3 days per week for initial exercise prescription. Provide home exercise prescription and guidelines that participant acknowledges understanding prior to discharge.  Activity Barriers & Risk  Stratification:     Activity Barriers & Risk Stratification - 04/20/15 1138    Activity Barriers & Risk Stratification   Activity Barriers None   Risk Stratification High      6 Minute Walk:     6 Minute Walk      03/11/15 1139       6 Minute Walk   Phase Initial     Distance 1240 feet     Walk Time 6 minutes     Resting HR 75 bpm     Resting BP 157/103 mmHg     Max Ex. HR 113 bpm     Max Ex. BP 160/106 mmHg     RPE 11     Symptoms No        Initial Exercise Prescription:   Exercise Prescription Changes:     Exercise Prescription Changes      04/22/15 0700 05/07/15 0800 05/19/15 1800       Exercise Review   Progression Yes Yes Yes     Response to Exercise   Blood Pressure (Admit) 158/118 mmHg  138/84 mmHg     Blood Pressure (Exercise) 180/110 mmHg  158/80 mmHg     Blood Pressure (Exit) 150/90 mmHg  138/84 mmHg     Heart Rate (Admit) 83 bpm  80 bpm     Heart Rate (Exercise) 107 bpm  107 bpm     Heart Rate (Exit) 65 bpm  87 bpm     Rating of Perceived Exertion (Exercise)  12  11     Symptoms   Angina with exercise, subsides with rest     Duration Progress to 30 minutes of continuous aerobic without signs/symptoms of physical distress  Progress to 30 minutes of continuous aerobic without signs/symptoms of physical distress     Intensity THRR unchanged  Exercise below the threshold that illicits symptoms  --  Exercise below the threshold that illicits symptoms  Rest + 30  Exercise below the threshold that illicits symptoms      Progression Continue progressive overload as per policy without signs/symptoms or physical distress.  Continue progressive overload as per policy without signs/symptoms or physical distress.     Resistance Training   Training Prescription Yes  Yes     Weight 4  4     Reps 10-12  10-12     Treadmill   MPH 3 -- 3     Grade 0 -- 0     Minutes 20 -- 20     Elliptical   Level  --      Speed  --      REL-XR   Level 7 8 8      Watts 70  85 85     Minutes 20 -- 20        Discharge Exercise Prescription (Final Exercise Prescription Changes):     Exercise Prescription Changes - 05/19/15 1800    Exercise Review   Progression Yes   Response to Exercise   Blood Pressure (Admit) 138/84 mmHg   Blood Pressure (Exercise) 158/80 mmHg   Blood Pressure (Exit) 138/84 mmHg   Heart Rate (Admit) 80 bpm   Heart Rate (Exercise) 107 bpm   Heart Rate (Exit) 87 bpm   Rating of Perceived Exertion (Exercise) 11   Symptoms Angina with exercise, subsides with rest   Duration Progress to 30 minutes of continuous aerobic without signs/symptoms of physical distress   Intensity Rest + 30  Exercise below the threshold that illicits symptoms    Progression Continue progressive overload as per policy without signs/symptoms or physical distress.   Resistance Training   Training Prescription Yes   Weight 4   Reps 10-12   Treadmill   MPH 3   Grade 0   Minutes 20   REL-XR   Level 8   Watts 85   Minutes 20      Nutrition:  Target Goals: Understanding of nutrition guidelines, daily intake of sodium 1500mg , cholesterol 200mg , calories 30% from fat and 7% or less from saturated fats, daily to have 5 or more servings of fruits and vegetables.  Biometrics:     Pre Biometrics - 04/18/15 1111    Pre Biometrics   Height 6' (1.829 m)   Weight 273 lb 3.2 oz (123.923 kg)   BMI (Calculated) 37.1       Nutrition Therapy Plan and Nutrition Goals:     Nutrition Therapy & Goals - 04/18/15 1109    Nutrition Therapy   Diet 2000 calorie meal plan; DASH diet principles   Drug/Food Interactions Statins/Certain Fruits   Fiber 30 grams   Whole Grain Foods 3 servings   Protein 9 ounces/day   Saturated Fats 13 max. grams   Fruits and Vegetables 5 servings/day   Personal Nutrition Goals   Personal Goal #1 Increase intake of fruits/vegetables with goal of at least 5 servings daily.   Personal Goal #2 To continue to read labels for saturated  fat, trans fat and sodium.  Nutrition Discharge: Rate Your Plate Scores:     Rate Your Plate - 16/10/96 1044    Rate Your Plate Scores   Pre Score 73   Pre Score % 81 %      Nutrition Goals Re-Evaluation:     Nutrition Goals Re-Evaluation      05/16/15 1157 05/19/15 0844 05/19/15 1013 05/20/15 1043     Personal Goal #1 Re-Evaluation   Personal Goal #1 Increase intake of fruits/vegetables with goal of at least 5 servings daily.   Increase intake of fruits/vegetables with goal of at least 5 servings daily.    Goal Progress Seen Yes Yes  Yes    Personal Goal #2 Re-Evaluation   Personal Goal #2 To continue to read labels for saturated fat, trans fat and sodium.   To continue to read labels for saturated fat, trans fat and sodium.    Goal Progress Seen Yes Yes Yes Yes    Comments Working at controlling sodium intake as well as the fats  Given calorie counting information per his request. He wants to lose some weight.  Given calorie counting information per his request. He wants to lose some weight.     Intervention Plan   Intervention Continue working on goals with interventions provided in exercise prescription and nutrition intervention sections.          Psychosocial: Target Goals: Acknowledge presence or absence of depression, maximize coping skills, provide positive support system. Participant is able to verbalize types and ability to use techniques and skills needed for reducing stress and depression.  Initial Review & Psychosocial Screening:   Quality of Life Scores:     Quality of Life - 03/11/15 1141    Quality of Life Scores   Health/Function Pre 28.87 %   Socioeconomic Pre 28.75 %   Psych/Spiritual Pre 30 %   Family Pre 26.4 %   GLOBAL Pre 28.71 %      PHQ-9:     Recent Review Flowsheet Data    There is no flowsheet data to display.      Psychosocial Evaluation and Intervention:   Psychosocial Re-Evaluation:   Vocational  Rehabilitation: Provide vocational rehab assistance to qualifying candidates.   Vocational Rehab Evaluation & Intervention:     Vocational Rehab - 04/20/15 1138    Initial Vocational Rehab Evaluation & Intervention   Assessment shows need for Vocational Rehabilitation No      Education: Education Goals: Education classes will be provided on a weekly basis, covering required topics. Participant will state understanding/return demonstration of topics presented.  Learning Barriers/Preferences:     Learning Barriers/Preferences - 04/20/15 1138    Learning Barriers/Preferences   Learning Barriers None   Learning Preferences None      Education Topics: General Nutrition Guidelines/Fats and Fiber: -Group instruction provided by verbal, written material, models and posters to present the general guidelines for heart healthy nutrition. Gives an explanation and review of dietary fats and fiber.   Controlling Sodium/Reading Food Labels: -Group verbal and written material supporting the discussion of sodium use in heart healthy nutrition. Review and explanation with models, verbal and written materials for utilization of the food label.   Exercise Physiology & Risk Factors: - Group verbal and written instruction with models to review the exercise physiology of the cardiovascular system and associated critical values. Details cardiovascular disease risk factors and the goals associated with each risk factor.   Aerobic Exercise & Resistance Training: - Gives group verbal and written discussion on the  health impact of inactivity. On the components of aerobic and resistive training programs and the benefits of this training and how to safely progress through these programs.   Flexibility, Balance, General Exercise Guidelines: - Provides group verbal and written instruction on the benefits of flexibility and balance training programs. Provides general exercise guidelines with specific  guidelines to those with heart or lung disease. Demonstration and skill practice provided.   Stress Management: - Provides group verbal and written instruction about the health risks of elevated stress, cause of high stress, and healthy ways to reduce stress.   Depression: - Provides group verbal and written instruction on the correlation between heart/lung disease and depressed mood, treatment options, and the stigmas associated with seeking treatment.   Anatomy & Physiology of the Heart: - Group verbal and written instruction and models provide basic cardiac anatomy and physiology, with the coronary electrical and arterial systems. Review of: AMI, Angina, Valve disease, Heart Failure, Cardiac Arrhythmia, Pacemakers, and the ICD.   Cardiac Procedures: - Group verbal and written instruction and models to describe the testing methods done to diagnose heart disease. Reviews the outcomes of the test results. Describes the treatment choices: Medical Management, Angioplasty, or Coronary Bypass Surgery.   Cardiac Medications: - Group verbal and written instruction to review commonly prescribed medications for heart disease. Reviews the medication, class of the drug, and side effects. Includes the steps to properly store meds and maintain the prescription regimen.   Go Sex-Intimacy & Heart Disease, Get SMART - Goal Setting: - Group verbal and written instruction through game format to discuss heart disease and the return to sexual intimacy. Provides group verbal and written material to discuss and apply goal setting through the application of the S.M.A.R.T. Method.   Other Matters of the Heart: - Provides group verbal, written materials and models to describe Heart Failure, Angina, Valve Disease, and Diabetes in the realm of heart disease. Includes description of the disease process and treatment options available to the cardiac patient.   Exercise & Equipment Safety: - Individual verbal  instruction and demonstration of equipment use and safety with use of the equipment.   Infection Prevention: - Provides verbal and written material to individual with discussion of infection control including proper hand washing and proper equipment cleaning during exercise session.   Falls Prevention: - Provides verbal and written material to individual with discussion of falls prevention and safety.   Diabetes: - Individual verbal and written instruction to review signs/symptoms of diabetes, desired ranges of glucose level fasting, after meals and with exercise. Advice that pre and post exercise glucose checks will be done for 3 sessions at entry of program.    Knowledge Questionnaire Score:     Knowledge Questionnaire Score - 05/20/15 1043    Knowledge Questionnaire Score   Pre Score 20/28      Personal Goals and Risk Factors at Admission:     Personal Goals and Risk Factors at Admission - 04/20/15 1140    Personal Goals and Risk Factors on Admission   Diabetes No   Hypertension Yes   Goal Participant will see blood pressure controlled within the values of 140/34mm/Hg or within value directed by their physician.   Intervention Provide nutrition & aerobic exercise along with prescribed medications to achieve BP 140/90 or less.   Lipids Yes   Goal Cholesterol controlled with medications as prescribed, with individualized exercise RX and with personalized nutrition plan. Value goals: LDL < 70mg , HDL > 40mg . Participant states understanding of  desired cholesterol values and following prescriptions.      Personal Goals and Risk Factors Review:      Goals and Risk Factor Review      04/18/15 0935 04/22/15 1317 05/02/15 0949 05/16/15 1158 05/19/15 0844   Weight Management   Goals Progress/Improvement seen No  No     Comments HAs not seen any changes yet.  To see RD today.  --  Spoke to Red about his weight and what steps he can take to help decrease his weight.   Talked to  him about finding an APP that helps him monitor his calorie/fat intake. He will look into this.     Increase Aerobic Exercise and Physical Activity   Goals Progress/Improvement seen  Yes   Yes Yes   Comments    Continues to work well with exercise progression    Take Less Medication   Goals Progress/Improvement seen No   No No   Comments MD is working on BP control. Expecting medication changes until BP under control   MD is working on BP control. Expecting medication changes until BP under control    Understand more about Heart/Pulmonary Disease   Goals Progress/Improvement seen  Yes   Yes Yes   Comments Attending classes, no questions.   Attending classes, no questions.    Hypertension   Goal  --  Continues to work closely with MD to gain control  of hypertension      Progress seen toward goals   Yes Yes Yes   Comments   --  A few BP readings have been lower this week.  Not consistent yet. He has been controlling sodium intake.  Will now focus on weight management. BP readings for last week have had diastolic under 100    Abnormal Lipids   Goal  --  No labs to see changes        05/19/15 1014 05/20/15 1044         Weight Management   Goals Progress/Improvement seen No No      Comments Given calorie count information per his request. He said he is not losing any weight.  Given calorie count information per his request. He said he is not losing any weight.       Increase Aerobic Exercise and Physical Activity   Goals Progress/Improvement seen  Yes Yes      Comments  Continues to work well with exercise progression      Take Less Medication   Goals Progress/Improvement seen  No      Comments  MD is working on BP control. Expecting medication changes until BP under control      Understand more about Heart/Pulmonary Disease   Goals Progress/Improvement seen   Yes      Comments  Attending classes, no questions.      Hypertension   Progress seen toward goals  Yes      Comments  BP  readings for last week have had diastolic under 100         Personal Goals Discharge:     Comments: 30 day review. Continue with ITP.

## 2015-05-21 ENCOUNTER — Other Ambulatory Visit: Payer: Self-pay | Admitting: *Deleted

## 2015-05-21 DIAGNOSIS — I214 Non-ST elevation (NSTEMI) myocardial infarction: Secondary | ICD-10-CM | POA: Diagnosis not present

## 2015-05-21 NOTE — Progress Notes (Signed)
Daily Session Note  Patient Details  Name: Carlos Bryant MRN: [id number] Date of Birth: 1971/09/04 Referring Provider:  Dionne Ano, MD  Encounter Date: 05/21/2015  Check In:     Session Check In - 05/21/15 0835    Check-In   Staff Present Heath Lark RN, BSN, CCRP;Renee Dillard Essex MS, ACSM CEP Exercise Physiologist;Nithya Meriweather BS, ACSM EP-C, Exercise Physiologist   ER physicians immediately available to respond to emergencies See telemetry face sheet for immediately available ER MD   Medication changes reported     No   Fall or balance concerns reported    No   Warm-up and Cool-down Performed on first and last piece of equipment   VAD Patient? No   Pain Assessment   Currently in Pain? No/denies         Goals Met:  Proper associated with RPD/PD & O2 Sat Exercise tolerated well No report of cardiac concerns or symptoms Strength training completed today  Goals Unmet:  Not Applicable  Goals Comments:    [name] is Medical Director for [facility] and [facility].

## 2015-05-26 ENCOUNTER — Encounter: Payer: BLUE CROSS/BLUE SHIELD | Admitting: *Deleted

## 2015-05-26 DIAGNOSIS — I214 Non-ST elevation (NSTEMI) myocardial infarction: Secondary | ICD-10-CM | POA: Diagnosis not present

## 2015-05-26 NOTE — Progress Notes (Signed)
Daily Session Note  Patient Details  Name: WILDON CUEVAS MRN: [id number] Date of Birth: December 01, 1970 Referring Provider:  Dionne Ano, MD  Encounter Date: 05/26/2015  Check In:     Session Check In - 05/26/15 0841    Check-In   Staff Present Heath Lark RN, BSN, CCRP;Kelly Hayes BS, ACSM CEP Exercise Physiologist;Arita Severtson Dillard Essex MS, ACSM CEP Exercise Physiologist   ER physicians immediately available to respond to emergencies See telemetry face sheet for immediately available ER MD   Medication changes reported     No   Fall or balance concerns reported    No   Warm-up and Cool-down Performed on first and last piece of equipment   VAD Patient? No   Pain Assessment   Currently in Pain? No/denies   Multiple Pain Sites No         Goals Met:  Independence with exercise equipment Exercise tolerated well No report of cardiac concerns or symptoms Strength training completed today  Goals Unmet:  Not Applicable  Goals Comments:    [name] is Medical Director for [facility].

## 2015-05-30 ENCOUNTER — Encounter: Payer: BLUE CROSS/BLUE SHIELD | Attending: Internal Medicine

## 2015-05-30 DIAGNOSIS — I214 Non-ST elevation (NSTEMI) myocardial infarction: Secondary | ICD-10-CM | POA: Insufficient documentation

## 2015-06-03 ENCOUNTER — Encounter: Payer: Self-pay | Admitting: *Deleted

## 2015-06-04 DIAGNOSIS — I214 Non-ST elevation (NSTEMI) myocardial infarction: Secondary | ICD-10-CM | POA: Diagnosis present

## 2015-06-09 ENCOUNTER — Encounter: Payer: BLUE CROSS/BLUE SHIELD | Admitting: *Deleted

## 2015-06-09 DIAGNOSIS — I214 Non-ST elevation (NSTEMI) myocardial infarction: Secondary | ICD-10-CM

## 2015-06-09 NOTE — Progress Notes (Signed)
Daily Session Note  Patient Details  Name: Carlos Bryant MRN: [id number] Date of Birth: Jun 09, 1971 Referring Provider:  Dionne Ano, MD  Encounter Date: 06/09/2015  Check In:     Session Check In - 06/09/15 0842    Check-In   Staff Present Candiss Norse MS, ACSM CEP Exercise Physiologist;Kelly Alfonso Patten, ACSM CEP Exercise Physiologist;Susanne Bice RN, BSN, [place]   ER physicians immediately available to respond to emergencies See telemetry face sheet for immediately available ER MD   Medication changes reported     No   Fall or balance concerns reported    No   Warm-up and Cool-down Performed on first and last piece of equipment   VAD Patient? No   Pain Assessment   Currently in Pain? Yes   Aggravating Factors  Onset with exercise, relieved with rest         Goals Met:  Independence with exercise equipment Exercise tolerated well No report of cardiac concerns or symptoms  Goals Unmet:  Red continues to have angina with exercise and this is relieved with rest. His physician is aware and he is cleared to continue with exercise  Goals Comments:    [name] is Medical Director for [place].

## 2015-06-11 DIAGNOSIS — I214 Non-ST elevation (NSTEMI) myocardial infarction: Secondary | ICD-10-CM

## 2015-06-11 NOTE — Progress Notes (Signed)
Daily Session Note  Patient Details  Name: Carlos Bryant MRN: [id number] Date of Birth: 11-Mar-1971 Referring Provider:  Dionne Ano, MD  Encounter Date: 06/11/2015  Check In:     Session Check In - 06/11/15 0821    Check-In   Staff Present Heath Lark RN, BSN, CCRP;Aidyn Sportsman BS, ACSM EP-C, Exercise Physiologist;Renee Dillard Essex MS, ACSM CEP Exercise Physiologist   ER physicians immediately available to respond to emergencies See telemetry face sheet for immediately available ER MD   Medication changes reported     No   Fall or balance concerns reported    No   Warm-up and Cool-down Performed on first and last piece of equipment   VAD Patient? No   Pain Assessment   Currently in Pain? No/denies         Goals Met:  Proper associated with RPD/PD & O2 Sat Exercise tolerated well No report of cardiac concerns or symptoms Strength training completed today  Goals Unmet:  Not Applicable  Goals Comments: Red was not feeling well today, having a headache, slightly dizzy.  Started to feel better after resting.  Will continue to monitor.   [name] is Medical Director for [facility].

## 2015-06-13 ENCOUNTER — Encounter: Payer: BLUE CROSS/BLUE SHIELD | Admitting: *Deleted

## 2015-06-13 DIAGNOSIS — I214 Non-ST elevation (NSTEMI) myocardial infarction: Secondary | ICD-10-CM

## 2015-06-13 NOTE — Progress Notes (Signed)
Daily Session Note  Patient Details  Name: Carlos Bryant MRN: [id number] Date of Birth: 1971-02-25 Referring Provider:  Dionne Ano, MD  Encounter Date: 06/13/2015  Check In:     Session Check In - 06/13/15 0908    Check-In   Staff Present Candiss Norse MS, ACSM CEP Exercise Physiologist;Carroll Enterkin RN, BSN;Susanne Bice RN, BSN, [place]   ER physicians immediately available to respond to emergencies See telemetry face sheet for immediately available ER MD   Warm-up and Cool-down Performed on first and last piece of equipment   VAD Patient? No   Pain Assessment   Currently in Pain? No/denies         Goals Met:  Independence with exercise equipment Exercise tolerated well Personal goals reviewed No report of cardiac concerns or symptoms Strength training completed today  Goals Unmet:  Not Applicable  Goals Comments:   [name] is Medical Director for [facility].

## 2015-06-17 ENCOUNTER — Encounter: Payer: Self-pay | Admitting: *Deleted

## 2015-06-17 DIAGNOSIS — I214 Non-ST elevation (NSTEMI) myocardial infarction: Secondary | ICD-10-CM

## 2015-06-17 NOTE — Progress Notes (Signed)
Cardiac Individual Treatment Plan  Patient Details  Name: Carlos Bryant MRN: [id number] Date of Birth: Jun 24, 1971 Referring Provider:  No ref. provider found  Initial Encounter Date:    Visit Diagnosis: Acute non-ST-elevation myocardial infarction  Patient's Home Medications on Admission:  Current outpatient prescriptions:  .  aspirin EC 81 MG tablet, Take by mouth., Disp: , Rfl:  .  atorvastatin (LIPITOR) 80 MG tablet, Take by mouth., Disp: , Rfl:  .  carvedilol (COREG) 25 MG tablet, Take 50 mg by mouth 2 (two) times daily. , Disp: , Rfl:  .  cloNIDine (CATAPRES - DOSED IN MG/24 HR) 0.1 mg/24hr patch, Place 0.1 patches onto the skin., Disp: , Rfl:  .  cloNIDine (CATAPRES - DOSED IN MG/24 HR) 0.2 mg/24hr patch, Place 0.2 mg onto the skin once a week., Disp: , Rfl:  .  hydrALAZINE (APRESOLINE) 10 MG tablet, Take 10 mg by mouth 2 (two) times daily., Disp: , Rfl:  .  hydrALAZINE (APRESOLINE) 25 MG tablet, Take 10 mg by mouth. , Disp: , Rfl:  .  losartan (COZAAR) 100 MG tablet, Take by mouth., Disp: , Rfl:  .  metoprolol succinate (TOPROL-XL) 25 MG 24 hr tablet, Take 50 mg by mouth. , Disp: , Rfl:  .  Multiple Vitamin (MULTI-VITAMINS) TABS, Take by mouth., Disp: , Rfl:  .  nitroGLYCERIN (NITROSTAT) 0.4 MG SL tablet, Place under the tongue., Disp: , Rfl:  .  ticagrelor (BRILINTA) 90 MG TABS tablet, Take by mouth 2 (two) times daily., Disp: , Rfl:  .  ticagrelor (BRILINTA) 90 MG TABS tablet, Take by mouth., Disp: , Rfl:   Past Medical History: No past medical history on file.  Tobacco Use: History  Smoking status  . Not on file  Smokeless tobacco  . Not on file    Labs: Recent Review Flowsheet Data    There is no flowsheet data to display.       Exercise Target Goals:    Exercise Program Goal: Individual exercise prescription set with THRR, safety & activity barriers. Participant demonstrates ability to understand and report RPE using BORG scale, to self-measure pulse  accurately, and to acknowledge the importance of the exercise prescription.  Exercise Prescription Goal: Starting with aerobic activity 30 plus minutes a day, 3 days per week for initial exercise prescription. Provide home exercise prescription and guidelines that participant acknowledges understanding prior to discharge.  Activity Barriers & Risk Stratification:     Activity Barriers & Risk Stratification - 04/20/15 1138    Activity Barriers & Risk Stratification   Activity Barriers None   Risk Stratification High      6 Minute Walk:     6 Minute Walk      03/11/15 1139 06/13/15 0908     6 Minute Walk   Phase Initial Discharge    Distance 1240 feet 1425 feet    Walk Time 6 minutes 6 minutes    Resting HR 75 bpm 81 bpm    Resting BP 157/103 mmHg 138/90 mmHg    Max Ex. HR 113 bpm 110 bpm    Max Ex. BP 160/106 mmHg 150/98 mmHg    RPE 11 11    Symptoms No No       Initial Exercise Prescription:   Exercise Prescription Changes:     Exercise Prescription Changes      04/22/15 0700 05/07/15 0800 05/19/15 1800 06/03/15 0700     Exercise Review   Progression Yes Yes Yes No  Do not  progress due to the continued angina    Response to Exercise   Blood Pressure (Admit) 158/118 mmHg  138/84 mmHg 150/100 mmHg    Blood Pressure (Exercise) 180/110 mmHg  158/80 mmHg 178/110 mmHg    Blood Pressure (Exit) 150/90 mmHg  138/84 mmHg 144/98 mmHg    Heart Rate (Admit) 83 bpm  80 bpm 70 bpm    Heart Rate (Exercise) 107 bpm  107 bpm 87 bpm    Heart Rate (Exit) 65 bpm  87 bpm 65 bpm    Rating of Perceived Exertion (Exercise) 12  11 13     Symptoms   Angina with exercise, subsides with rest Angina with exercise, subsides with rest    Duration Progress to 30 minutes of continuous aerobic without signs/symptoms of physical distress  Progress to 30 minutes of continuous aerobic without signs/symptoms of physical distress Progress to 30 minutes of continuous aerobic without signs/symptoms of  physical distress    Intensity THRR unchanged  Exercise below the threshold that illicits symptoms  --  Exercise below the threshold that illicits symptoms  Rest + 30  Exercise below the threshold that illicits symptoms  Rest + 30  Exercise below the threshold that illicits symptoms     Progression Continue progressive overload as per policy without signs/symptoms or physical distress.  Continue progressive overload as per policy without signs/symptoms or physical distress. Continue progressive overload as per policy without signs/symptoms or physical distress.    Resistance Training   Training Prescription Yes  Yes Yes    Weight 4  4 4     Reps 10-12  10-12 10-15    Interval Training   Interval Training    No    Treadmill   MPH 3 -- 3 3    Grade 0 -- 0 0    Minutes 20 -- 20 20    Elliptical   Level  --      Speed  --      REL-XR   Level 7 8 8 8     Watts 70 85 85 85    Minutes 20 -- 20 20       Discharge Exercise Prescription (Final Exercise Prescription Changes):     Exercise Prescription Changes - 06/03/15 0700    Exercise Review   Progression No  Do not progress due to the continued angina   Response to Exercise   Blood Pressure (Admit) 150/100 mmHg   Blood Pressure (Exercise) 178/110 mmHg   Blood Pressure (Exit) 144/98 mmHg   Heart Rate (Admit) 70 bpm   Heart Rate (Exercise) 87 bpm   Heart Rate (Exit) 65 bpm   Rating of Perceived Exertion (Exercise) 13   Symptoms Angina with exercise, subsides with rest   Duration Progress to 30 minutes of continuous aerobic without signs/symptoms of physical distress   Intensity Rest + 30  Exercise below the threshold that illicits symptoms    Progression Continue progressive overload as per policy without signs/symptoms or physical distress.   Resistance Training   Training Prescription Yes   Weight 4   Reps 10-15   Interval Training   Interval Training No   Treadmill   MPH 3   Grade 0   Minutes 20   REL-XR   Level 8    Watts 85   Minutes 20      Nutrition:  Target Goals: Understanding of nutrition guidelines, daily intake of sodium 1500mg , cholesterol 200mg , calories 30% from fat and 7% or less from saturated fats,  daily to have 5 or more servings of fruits and vegetables.  Biometrics:     Pre Biometrics - 04/18/15 1111    Pre Biometrics   Height 6' (1.829 m)   Weight 273 lb 3.2 oz (123.923 kg)   BMI (Calculated) 37.1       Nutrition Therapy Plan and Nutrition Goals:     Nutrition Therapy & Goals - 04/18/15 1109    Nutrition Therapy   Diet 2000 calorie meal plan; DASH diet principles   Drug/Food Interactions Statins/Certain Fruits   Fiber 30 grams   Whole Grain Foods 3 servings   Protein 9 ounces/day   Saturated Fats 13 max. grams   Fruits and Vegetables 5 servings/day   Personal Nutrition Goals   Personal Goal #1 Increase intake of fruits/vegetables with goal of at least 5 servings daily.   Personal Goal #2 To continue to read labels for saturated fat, trans fat and sodium.      Nutrition Discharge: Rate Your Plate Scores:     Rate Your Plate - 16/10/96 1044    Rate Your Plate Scores   Pre Score 73   Pre Score % 81 %      Nutrition Goals Re-Evaluation:     Nutrition Goals Re-Evaluation      05/16/15 1157 05/19/15 0844 05/19/15 1013 05/20/15 1043 06/13/15 1039   Personal Goal #1 Re-Evaluation   Personal Goal #1 Increase intake of fruits/vegetables with goal of at least 5 servings daily.   Increase intake of fruits/vegetables with goal of at least 5 servings daily.    Goal Progress Seen Yes Yes  Yes    Personal Goal #2 Re-Evaluation   Personal Goal #2 To continue to read labels for saturated fat, trans fat and sodium.   To continue to read labels for saturated fat, trans fat and sodium.    Goal Progress Seen Yes Yes Yes Yes Yes   Comments Working at controlling sodium intake as well as the fats  Given calorie counting information per his request. He wants to lose some  weight.  Given calorie counting information per his request. He wants to lose some weight.  Has not gained weight. Is watching what he is eating. Discussed today since he is stressed to try to focus on what he is eating when he is chewing.    Intervention Plan   Intervention Continue working on goals with interventions provided in exercise prescription and nutrition intervention sections.         06/17/15 1453           Personal Goal #1 Re-Evaluation   Personal Goal #1 Increase intake of fruits/vegetables with goal of at least 5 servings daily.       Goal Progress Seen Yes       Comments Continues to follow goal guidelines       Personal Goal #2 Re-Evaluation   Personal Goal #2 To continue to read labels for saturated fat, trans fat and sodium.       Goal Progress Seen Yes       Comments Reading labels          Psychosocial: Target Goals: Acknowledge presence or absence of depression, maximize coping skills, provide positive support system. Participant is able to verbalize types and ability to use techniques and skills needed for reducing stress and depression.  Initial Review & Psychosocial Screening:   Quality of Life Scores:     Quality of Life - 03/11/15 1141    Quality  of Life Scores   Health/Function Pre 28.87 %   Socioeconomic Pre 28.75 %   Psych/Spiritual Pre 30 %   Family Pre 26.4 %   GLOBAL Pre 28.71 %      PHQ-9:     Recent Review Flowsheet Data    There is no flowsheet data to display.      Psychosocial Evaluation and Intervention:   Psychosocial Re-Evaluation:   Vocational Rehabilitation: Provide vocational rehab assistance to qualifying candidates.   Vocational Rehab Evaluation & Intervention:     Vocational Rehab - 04/20/15 1138    Initial Vocational Rehab Evaluation & Intervention   Assessment shows need for Vocational Rehabilitation No      Education: Education Goals: Education classes will be provided on a weekly basis, covering  required topics. Participant will state understanding/return demonstration of topics presented.  Learning Barriers/Preferences:     Learning Barriers/Preferences - 04/20/15 1138    Learning Barriers/Preferences   Learning Barriers None   Learning Preferences None      Education Topics: General Nutrition Guidelines/Fats and Fiber: -Group instruction provided by verbal, written material, models and posters to present the general guidelines for heart healthy nutrition. Gives an explanation and review of dietary fats and fiber.          Cardiac Rehab from 06/11/2015 in [facility] Cardiac Rehab   Date  05/19/15   Educator  C. Perlie Gold, RD   Instruction Review Code  2- meets goals/outcomes      Controlling Sodium/Reading Food Labels: -Group verbal and written material supporting the discussion of sodium use in heart healthy nutrition. Review and explanation with models, verbal and written materials for utilization of the food label.      Cardiac Rehab from 06/11/2015 in [facility] Cardiac Rehab   Date  05/26/15   Educator  CR   Instruction Review Code  2- meets goals/outcomes      Exercise Physiology & Risk Factors: - Group verbal and written instruction with models to review the exercise physiology of the cardiovascular system and associated critical values. Details cardiovascular disease risk factors and the goals associated with each risk factor.      Cardiac Rehab from 06/11/2015 in [facility] Cardiac Rehab   Date  06/11/15   Educator  RM   Instruction Review Code  2- meets goals/outcomes      Aerobic Exercise & Resistance Training: - Gives group verbal and written discussion on the health impact of inactivity. On the components of aerobic and resistive training programs and the benefits of this training and how to safely progress through these programs.      Cardiac Rehab from 06/11/2015 in [facility] Cardiac Rehab   Date  04/21/15   Educator  Asencion Islam   Instruction Review Code  2- meets  goals/outcomes      Flexibility, Balance, General Exercise Guidelines: - Provides group verbal and written instruction on the benefits of flexibility and balance training programs. Provides general exercise guidelines with specific guidelines to those with heart or lung disease. Demonstration and skill practice provided.   Stress Management: - Provides group verbal and written instruction about the health risks of elevated stress, cause of high stress, and healthy ways to reduce stress.      Cardiac Rehab from 06/11/2015 in [facility] Cardiac Rehab   Date  04/30/15   Educator  [facility]   Instruction Review Code  2- meets goals/outcomes      Depression: - Provides group verbal and written instruction on the correlation between heart/lung  disease and depressed mood, treatment options, and the stigmas associated with seeking treatment.   Anatomy & Physiology of the Heart: - Group verbal and written instruction and models provide basic cardiac anatomy and physiology, with the coronary electrical and arterial systems. Review of: AMI, Angina, Valve disease, Heart Failure, Cardiac Arrhythmia, Pacemakers, and the ICD.      Cardiac Rehab from 06/11/2015 in [facility] Cardiac Rehab   Date  05/05/15   Educator  S. Bice   Instruction Review Code  2- meets goals/outcomes      Cardiac Procedures: - Group verbal and written instruction and models to describe the testing methods done to diagnose heart disease. Reviews the outcomes of the test results. Describes the treatment choices: Medical Management, Angioplasty, or Coronary Bypass Surgery.   Cardiac Medications: - Group verbal and written instruction to review commonly prescribed medications for heart disease. Reviews the medication, class of the drug, and side effects. Includes the steps to properly store meds and maintain the prescription regimen.      Cardiac Rehab from 06/11/2015 in [facility] Cardiac Rehab   Date  06/09/15   Educator  SB   Instruction Review  Code  2- meets goals/outcomes      Go Sex-Intimacy & Heart Disease, Get SMART - Goal Setting: - Group verbal and written instruction through game format to discuss heart disease and the return to sexual intimacy. Provides group verbal and written material to discuss and apply goal setting through the application of the S.M.A.R.T. Method.   Other Matters of the Heart: - Provides group verbal, written materials and models to describe Heart Failure, Angina, Valve Disease, and Diabetes in the realm of heart disease. Includes description of the disease process and treatment options available to the cardiac patient.      Cardiac Rehab from 06/11/2015 in [facility] Cardiac Rehab   Date  05/14/15   Educator  SB   Instruction Review Code  2- meets goals/outcomes      Exercise & Equipment Safety: - Individual verbal instruction and demonstration of equipment use and safety with use of the equipment.   Infection Prevention: - Provides verbal and written material to individual with discussion of infection control including proper hand washing and proper equipment cleaning during exercise session.   Falls Prevention: - Provides verbal and written material to individual with discussion of falls prevention and safety.   Diabetes: - Individual verbal and written instruction to review signs/symptoms of diabetes, desired ranges of glucose level fasting, after meals and with exercise. Advice that pre and post exercise glucose checks will be done for 3 sessions at entry of program.    Knowledge Questionnaire Score:     Knowledge Questionnaire Score - 05/20/15 1043    Knowledge Questionnaire Score   Pre Score 20/28      Personal Goals and Risk Factors at Admission:     Personal Goals and Risk Factors at Admission - 04/20/15 1140    Personal Goals and Risk Factors on Admission   Diabetes No   Hypertension Yes   Goal Participant will see blood pressure controlled within the values of 140/83mm/Hg  or within value directed by their physician.   Intervention Provide nutrition & aerobic exercise along with prescribed medications to achieve BP 140/90 or less.   Lipids Yes   Goal Cholesterol controlled with medications as prescribed, with individualized exercise RX and with personalized nutrition plan. Value goals: LDL < 70mg , HDL > 40mg . Participant states understanding of desired cholesterol values and following prescriptions.  Personal Goals and Risk Factors Review:      Goals and Risk Factor Review      04/18/15 0935 04/22/15 1317 05/02/15 0949 05/16/15 1158 05/19/15 0844   Weight Management   Goals Progress/Improvement seen No  No     Comments HAs not seen any changes yet.  To see RD today.  --  Spoke to Red about his weight and what steps he can take to help decrease his weight.   Talked to him about finding an APP that helps him monitor his calorie/fat intake. He will look into this.     Increase Aerobic Exercise and Physical Activity   Goals Progress/Improvement seen  Yes   Yes Yes   Comments    Continues to work well with exercise progression    Take Less Medication   Goals Progress/Improvement seen No   No No   Comments MD is working on BP control. Expecting medication changes until BP under control   MD is working on BP control. Expecting medication changes until BP under control    Understand more about Heart/Pulmonary Disease   Goals Progress/Improvement seen  Yes   Yes Yes   Comments Attending classes, no questions.   Attending classes, no questions.    Hypertension   Goal  --  Continues to work closely with MD to gain control  of hypertension      Progress seen toward goals   Yes Yes Yes   Comments   --  A few BP readings have been lower this week.  Not consistent yet. He has been controlling sodium intake.  Will now focus on weight management. BP readings for last week have had diastolic under 100    Abnormal Lipids   Goal  --  No labs to see changes         05/19/15 1014 05/20/15 1044 06/13/15 1040       Weight Management   Goals Progress/Improvement seen No No Yes     Comments Given calorie count information per his request. He said he is not losing any weight.  Given calorie count information per his request. He said he is not losing any weight.  Is maintaing his weight and not gaining even though the past 2 weeks have been stressful for him.      Increase Aerobic Exercise and Physical Activity   Goals Progress/Improvement seen  Yes Yes Yes     Comments  Continues to work well with exercise progression He said the treadmill exercisng is helping him. He did his 6 minute walk today with no problems and increased actual feet that he walked when he first started Cardiac Rehab. Plans on joining a gym after Cardiac Rehab.      Take Less Medication   Goals Progress/Improvement seen  No      Comments  MD is working on BP control. Expecting medication changes until BP under control      Understand more about Heart/Pulmonary Disease   Goals Progress/Improvement seen   Yes      Comments  Attending classes, no questions.      Hypertension   Progress seen toward goals  Yes      Comments  BP readings for last week have had diastolic under 100         Personal Goals Discharge:     Comments: 30 day review. Continue with ITP.

## 2015-06-18 ENCOUNTER — Other Ambulatory Visit: Payer: Self-pay | Admitting: *Deleted

## 2015-06-18 DIAGNOSIS — I214 Non-ST elevation (NSTEMI) myocardial infarction: Secondary | ICD-10-CM

## 2015-06-18 NOTE — Progress Notes (Signed)
Daily Session Note  Patient Details  Name: Carlos Bryant MRN: [id number] Date of Birth: 1971/05/12 Referring Provider:  Dionne Ano, MD  Encounter Date: 06/18/2015  Check In:     Session Check In - 06/18/15 1209    Pain Assessment   Currently in Pain? Yes   Pain Score --  Intermittent Chest pressure when he walked on the treadmill.   Pain Location Chest   Pain Orientation Mid   Pain Descriptors / Indicators Discomfort   Pain Onset More than a month ago   Pain Frequency Intermittent   Pain Relieving Factors Onset with exericse relieved with rest.          Goals Met:  Independence with exercise equipment  Goals Unmet:  Not Applicable  Goals Comments: Did not get charged for this Cardiac Rehab session since he did not exercise at least 31 minutes. Carlos Bryant had intermittent chest pressure and headaches. I called Dr. Ammie Ferrier office and Dr. Sabra Heck saw patient. Carlos Bryant reports Dr. Sabra Heck will send him to his cardiologist to work up his chest pain but Dr. Sabra Heck did give him something for his headaches and increased his blood pressure medicine.    [name] is Medical Director for [place] and [facility].

## 2015-06-18 NOTE — Progress Notes (Signed)
Daily Session Note  Patient Details  Name: JERMELL HOLEMAN MRN: [id number] Date of Birth: 12-22-1970 Referring Provider:  Dionne Ano, MD  Encounter Date: 06/18/2015  Check In:     Session Check In - 06/18/15 0903    Check-In   Staff Present Lestine Box BS, ACSM EP-C, Exercise Physiologist;Renee Dillard Essex MS, ACSM CEP Exercise Physiologist;Carroll Enterkin RN, BSN   ER physicians immediately available to respond to emergencies See telemetry face sheet for immediately available ER MD   Medication changes reported     No   Fall or balance concerns reported    No   Warm-up and Cool-down Performed on first and last piece of equipment   VAD Patient? No   Pain Assessment   Currently in Pain? No/denies         Goals Met:  Proper associated with RPD/PD & O2 Sat  Goals Unmet:  Not Applicable  Goals Comments:    [name] is Medical Director for [place] and [facility].

## 2015-06-25 ENCOUNTER — Encounter: Payer: BLUE CROSS/BLUE SHIELD | Admitting: *Deleted

## 2015-06-25 DIAGNOSIS — I214 Non-ST elevation (NSTEMI) myocardial infarction: Secondary | ICD-10-CM

## 2015-06-25 NOTE — Progress Notes (Signed)
Daily Session Note  Patient Details  Name: Carlos Bryant MRN: [id number] Date of Birth: 1971/07/23 Referring Provider:  Dionne Ano, MD  Encounter Date: 06/25/2015  Check In:     Session Check In - 06/25/15 0844    Check-In   Staff Present Candiss Norse MS, ACSM CEP Exercise Physiologist;Steven Way BS, ACSM EP-C, Exercise Physiologist;Susanne Bice RN, BSN, CCRP   ER physicians immediately available to respond to emergencies See telemetry face sheet for immediately available ER MD   Medication changes reported     No   Fall or balance concerns reported    No   Warm-up and Cool-down Performed on first and last piece of equipment   VAD Patient? No   Pain Assessment   Currently in Pain? No/denies   Multiple Pain Sites No         Goals Met:  Independence with exercise equipment Exercise tolerated well No report of cardiac concerns or symptoms Strength training completed today  Goals Unmet:  Not Applicable  Goals Comments:   [name] is Medical Director for [place] and [facility].

## 2015-07-02 ENCOUNTER — Encounter: Payer: BLUE CROSS/BLUE SHIELD | Attending: Internal Medicine

## 2015-07-02 DIAGNOSIS — I214 Non-ST elevation (NSTEMI) myocardial infarction: Secondary | ICD-10-CM | POA: Diagnosis not present

## 2015-07-02 NOTE — Progress Notes (Signed)
Daily Session Note  Patient Details  Name: MARCELLAS MARCHANT MRN: [id number] Date of Birth: [birth year] Referring Provider:  Dionne Ano, MD  Encounter Date: 07/02/2015  Check In:     Session Check In - 07/02/15 0946    Check-In   Staff Present Heath Lark RN, BSN, CCRP;Tayler Heiden BS, ACSM EP-C, Exercise Physiologist;Renee Dillard Essex MS, ACSM CEP Exercise Physiologist   ER physicians immediately available to respond to emergencies See telemetry face sheet for immediately available ER MD   Medication changes reported     No   Fall or balance concerns reported    No   Warm-up and Cool-down Performed on first and last piece of equipment   VAD Patient? No   Pain Assessment   Currently in Pain? No/denies         Goals Met:  Proper associated with RPD/PD & O2 Sat Exercise tolerated well No report of cardiac concerns or symptoms Strength training completed today  Goals Unmet:  Not Applicable  Goals Comments:    [name] is Medical Director for [place] and [facility].

## 2015-07-04 ENCOUNTER — Other Ambulatory Visit: Payer: Self-pay | Admitting: Internal Medicine

## 2015-07-04 ENCOUNTER — Encounter: Payer: Self-pay | Admitting: *Deleted

## 2015-07-04 DIAGNOSIS — G44021 Chronic cluster headache, intractable: Secondary | ICD-10-CM

## 2015-07-04 DIAGNOSIS — I214 Non-ST elevation (NSTEMI) myocardial infarction: Secondary | ICD-10-CM

## 2015-07-08 ENCOUNTER — Ambulatory Visit
Admission: RE | Admit: 2015-07-08 | Discharge: 2015-07-08 | Disposition: A | Discharge status: Home / Self Care | Payer: BLUE CROSS/BLUE SHIELD | Source: Ambulatory Visit | Attending: Internal Medicine | Admitting: Internal Medicine

## 2015-07-08 ENCOUNTER — Encounter: Payer: Self-pay | Admitting: *Deleted

## 2015-07-08 DIAGNOSIS — G44021 Chronic cluster headache, intractable: Secondary | ICD-10-CM | POA: Diagnosis not present

## 2015-07-08 HISTORY — DX: Essential (primary) hypertension: I10

## 2015-07-08 MED ORDER — IOHEXOL 300 MG/ML  SOLN
100.0000 mL | Freq: Once | INTRAMUSCULAR | Status: AC | PRN
  Administered 2015-07-08: 60 mL via INTRAVENOUS

## 2015-07-10 ENCOUNTER — Telehealth: Payer: Self-pay | Admitting: *Deleted

## 2015-07-10 ENCOUNTER — Encounter: Payer: Self-pay | Admitting: *Deleted

## 2015-07-10 DIAGNOSIS — I214 Non-ST elevation (NSTEMI) myocardial infarction: Secondary | ICD-10-CM

## 2015-07-10 NOTE — Telephone Encounter (Signed)
I called Carlos Bryant to check on him since he is almost done with Cardiac Rehab has completed 35/36 sessions. Left vm and asked him to call us back if he is to return.

## 2015-07-10 NOTE — Progress Notes (Signed)
Cardiac Individual Treatment Plan  Patient Details  Name: Carlos Bryant MRN: [id number] Date of Birth: July 28, 1971 Referring Provider:  Rafael Bihari, MD  Initial Encounter Date:    Visit Diagnosis: Acute non-ST-elevation myocardial infarction  Patient's Home Medications on Admission:  Current outpatient prescriptions:  .  aspirin EC 81 MG tablet, Take by mouth., Disp: , Rfl:  .  atorvastatin (LIPITOR) 80 MG tablet, Take by mouth., Disp: , Rfl:  .  carvedilol (COREG) 25 MG tablet, Take 50 mg by mouth 2 (two) times daily. , Disp: , Rfl:  .  cloNIDine (CATAPRES - DOSED IN MG/24 HR) 0.1 mg/24hr patch, Place 0.1 patches onto the skin., Disp: , Rfl:  .  cloNIDine (CATAPRES - DOSED IN MG/24 HR) 0.2 mg/24hr patch, Place 0.2 mg onto the skin once a week., Disp: , Rfl:  .  hydrALAZINE (APRESOLINE) 10 MG tablet, Take 10 mg by mouth 2 (two) times daily., Disp: , Rfl:  .  hydrALAZINE (APRESOLINE) 25 MG tablet, Take 10 mg by mouth. , Disp: , Rfl:  .  losartan (COZAAR) 100 MG tablet, Take by mouth., Disp: , Rfl:  .  metoprolol succinate (TOPROL-XL) 25 MG 24 hr tablet, Take 50 mg by mouth. , Disp: , Rfl:  .  Multiple Vitamin (MULTI-VITAMINS) TABS, Take by mouth., Disp: , Rfl:  .  nitroGLYCERIN (NITROSTAT) 0.4 MG SL tablet, Place under the tongue., Disp: , Rfl:  .  ticagrelor (BRILINTA) 90 MG TABS tablet, Take by mouth 2 (two) times daily., Disp: , Rfl:  .  ticagrelor (BRILINTA) 90 MG TABS tablet, Take by mouth., Disp: , Rfl:   Past Medical History: Past Medical History  Diagnosis Date  . Hypertension     Tobacco Use: History  Smoking status  . Not on file  Smokeless tobacco  . Not on file    Labs: Recent Review Flowsheet Data    There is no flowsheet data to display.       Exercise Target Goals:    Exercise Program Goal: Individual exercise prescription set with THRR, safety & activity barriers. Participant demonstrates ability to understand and report RPE using BORG  scale, to self-measure pulse accurately, and to acknowledge the importance of the exercise prescription.  Exercise Prescription Goal: Starting with aerobic activity 30 plus minutes a day, 3 days per week for initial exercise prescription. Provide home exercise prescription and guidelines that participant acknowledges understanding prior to discharge.  Activity Barriers & Risk Stratification:     Activity Barriers & Risk Stratification - 04/20/15 1138    Activity Barriers & Risk Stratification   Activity Barriers None   Risk Stratification High      6 Minute Walk:     6 Minute Walk      03/11/15 1139 06/13/15 0908 07/04/15 0748   6 Minute Walk   Phase Initial Discharge    Distance 1240 feet 1425 feet 1425 feet   Walk Time 6 minutes 6 minutes 6 minutes   Resting HR 75 bpm 81 bpm 81 bpm   Resting BP 157/103 mmHg 138/90 mmHg 138/90 mmHg   Max Ex. HR 113 bpm 110 bpm 110 bpm   Max Ex. BP 160/106 mmHg 150/98 mmHg 150/98 mmHg   RPE 11 11 11    Symptoms No No       Initial Exercise Prescription:   Exercise Prescription Changes:     Exercise Prescription Changes      04/22/15 0700 05/07/15 0800 05/19/15 1800 06/03/15 0700 07/08/15 0700  Exercise Review   Progression Yes Yes Yes No  Do not progress due to the continued angina No  Do not progress due to the continued angina   Response to Exercise   Blood Pressure (Admit) 158/118 mmHg  138/84 mmHg 150/100 mmHg 146/98 mmHg   Blood Pressure (Exercise) 180/110 mmHg  158/80 mmHg 178/110 mmHg 148/94 mmHg   Blood Pressure (Exit) 150/90 mmHg  138/84 mmHg 144/98 mmHg 128/88 mmHg   Heart Rate (Admit) 83 bpm  80 bpm 70 bpm 71 bpm   Heart Rate (Exercise) 107 bpm  107 bpm 87 bpm 104 bpm   Heart Rate (Exit) 65 bpm  87 bpm 65 bpm 78 bpm   Rating of Perceived Exertion (Exercise) 12  11 13 12    Symptoms   Angina with exercise, subsides with rest Angina with exercise, subsides with rest Angina with exercise, subsides with rest   Duration  Progress to 30 minutes of continuous aerobic without signs/symptoms of physical distress  Progress to 30 minutes of continuous aerobic without signs/symptoms of physical distress Progress to 30 minutes of continuous aerobic without signs/symptoms of physical distress Progress to 30 minutes of continuous aerobic without signs/symptoms of physical distress   Intensity THRR unchanged  Exercise below the threshold that illicits symptoms  --  Exercise below the threshold that illicits symptoms  Rest + 30  Exercise below the threshold that illicits symptoms  Rest + 30  Exercise below the threshold that illicits symptoms  Rest + 30  Exercise below the threshold that illicits symptoms    Progression Continue progressive overload as per policy without signs/symptoms or physical distress.  Continue progressive overload as per policy without signs/symptoms or physical distress. Continue progressive overload as per policy without signs/symptoms or physical distress. Continue progressive overload as per policy without signs/symptoms or physical distress.   Resistance Training   Training Prescription Yes  Yes Yes Yes   Weight 4  4 4 4    Reps 10-12  10-12 10-15 10-15   Interval Training   Interval Training    No No   Treadmill   MPH 3 -- 3 3 3    Grade 0 -- 0 0 0   Minutes 20 -- 20 20 20    Elliptical   Level  --      Speed  --      REL-XR   Level 7 8 8 8 8    Watts 70 85 85 85 85   Minutes 20 -- 20 20 20       Discharge Exercise Prescription (Final Exercise Prescription Changes):     Exercise Prescription Changes - 07/08/15 0700    Exercise Review   Progression No  Do not progress due to the continued angina   Response to Exercise   Blood Pressure (Admit) 146/98 mmHg   Blood Pressure (Exercise) 148/94 mmHg   Blood Pressure (Exit) 128/88 mmHg   Heart Rate (Admit) 71 bpm   Heart Rate (Exercise) 104 bpm   Heart Rate (Exit) 78 bpm   Rating of Perceived Exertion (Exercise) 12   Symptoms Angina  with exercise, subsides with rest   Duration Progress to 30 minutes of continuous aerobic without signs/symptoms of physical distress   Intensity Rest + 30  Exercise below the threshold that illicits symptoms    Progression Continue progressive overload as per policy without signs/symptoms or physical distress.   Resistance Training   Training Prescription Yes   Weight 4   Reps 10-15   Interval Training  Interval Training No   Treadmill   MPH 3   Grade 0   Minutes 20   REL-XR   Level 8   Watts 85   Minutes 20      Nutrition:  Target Goals: Understanding of nutrition guidelines, daily intake of sodium 1500mg , cholesterol 200mg , calories 30% from fat and 7% or less from saturated fats, daily to have 5 or more servings of fruits and vegetables.  Biometrics:     Pre Biometrics - 04/18/15 1111    Pre Biometrics   Height 6' (1.829 m)   Weight 273 lb 3.2 oz (123.923 kg)   BMI (Calculated) 37.1       Nutrition Therapy Plan and Nutrition Goals:     Nutrition Therapy & Goals - 04/18/15 1109    Nutrition Therapy   Diet 2000 calorie meal plan; DASH diet principles   Drug/Food Interactions Statins/Certain Fruits   Fiber 30 grams   Whole Grain Foods 3 servings   Protein 9 ounces/day   Saturated Fats 13 max. grams   Fruits and Vegetables 5 servings/day   Personal Nutrition Goals   Personal Goal #1 Increase intake of fruits/vegetables with goal of at least 5 servings daily.   Personal Goal #2 To continue to read labels for saturated fat, trans fat and sodium.      Nutrition Discharge: Rate Your Plate Scores:     Rate Your Plate - 16/10/96 1044    Rate Your Plate Scores   Pre Score 73   Pre Score % 81 %      Nutrition Goals Re-Evaluation:     Nutrition Goals Re-Evaluation      05/16/15 1157 05/19/15 0844 05/19/15 1013 05/20/15 1043 06/13/15 1039   Personal Goal #1 Re-Evaluation   Personal Goal #1 Increase intake of fruits/vegetables with goal of at least 5  servings daily.   Increase intake of fruits/vegetables with goal of at least 5 servings daily.    Goal Progress Seen Yes Yes  Yes    Personal Goal #2 Re-Evaluation   Personal Goal #2 To continue to read labels for saturated fat, trans fat and sodium.   To continue to read labels for saturated fat, trans fat and sodium.    Goal Progress Seen Yes Yes Yes Yes Yes   Comments Working at controlling sodium intake as well as the fats  Given calorie counting information per his request. He wants to lose some weight.  Given calorie counting information per his request. He wants to lose some weight.  Has not gained weight. Is watching what he is eating. Discussed today since he is stressed to try to focus on what he is eating when he is chewing.    Intervention Plan   Intervention Continue working on goals with interventions provided in exercise prescription and nutrition intervention sections.         06/17/15 1453           Personal Goal #1 Re-Evaluation   Personal Goal #1 Increase intake of fruits/vegetables with goal of at least 5 servings daily.       Goal Progress Seen Yes       Comments Continues to follow goal guidelines       Personal Goal #2 Re-Evaluation   Personal Goal #2 To continue to read labels for saturated fat, trans fat and sodium.       Goal Progress Seen Yes       Comments Reading labels  Psychosocial: Target Goals: Acknowledge presence or absence of depression, maximize coping skills, provide positive support system. Participant is able to verbalize types and ability to use techniques and skills needed for reducing stress and depression.  Initial Review & Psychosocial Screening:   Quality of Life Scores:     Quality of Life - 03/11/15 1141    Quality of Life Scores   Health/Function Pre 28.87 %   Socioeconomic Pre 28.75 %   Psych/Spiritual Pre 30 %   Family Pre 26.4 %   GLOBAL Pre 28.71 %      PHQ-9:     Recent Review Flowsheet Data    There is no  flowsheet data to display.      Psychosocial Evaluation and Intervention:   Psychosocial Re-Evaluation:   Vocational Rehabilitation: Provide vocational rehab assistance to qualifying candidates.   Vocational Rehab Evaluation & Intervention:     Vocational Rehab - 04/20/15 1138    Initial Vocational Rehab Evaluation & Intervention   Assessment shows need for Vocational Rehabilitation No      Education: Education Goals: Education classes will be provided on a weekly basis, covering required topics. Participant will state understanding/return demonstration of topics presented.  Learning Barriers/Preferences:     Learning Barriers/Preferences - 04/20/15 1138    Learning Barriers/Preferences   Learning Barriers None   Learning Preferences None      Education Topics: General Nutrition Guidelines/Fats and Fiber: -Group instruction provided by verbal, written material, models and posters to present the general guidelines for heart healthy nutrition. Gives an explanation and review of dietary fats and fiber.          Cardiac Rehab from 06/11/2015 in [facility]   Date  05/19/15   Educator  C. Perlie Gold, RD   Instruction Review Code  2- meets goals/outcomes      Controlling Sodium/Reading Food Labels: -Group verbal and written material supporting the discussion of sodium use in heart healthy nutrition. Review and explanation with models, verbal and written materials for utilization of the food label.      Cardiac Rehab from 06/11/2015 in [facility] Cardiac Rehab   Date  05/26/15   Educator  CR   Instruction Review Code  2- meets goals/outcomes      Exercise Physiology & Risk Factors: - Group verbal and written instruction with models to review the exercise physiology of the cardiovascular system and associated critical values. Details cardiovascular disease risk factors and the goals associated with each risk factor.      Cardiac Rehab from 06/11/2015 in [facility] Cardiac Rehab    Date  06/11/15   Educator  RM   Instruction Review Code  2- meets goals/outcomes      Aerobic Exercise & Resistance Training: - Gives group verbal and written discussion on the health impact of inactivity. On the components of aerobic and resistive training programs and the benefits of this training and how to safely progress through these programs.      Cardiac Rehab from 06/11/2015 in [facility] Cardiac Rehab   Date  04/21/15   Educator  Asencion Islam   Instruction Review Code  2- meets goals/outcomes      Flexibility, Balance, General Exercise Guidelines: - Provides group verbal and written instruction on the benefits of flexibility and balance training programs. Provides general exercise guidelines with specific guidelines to those with heart or lung disease. Demonstration and skill practice provided.   Stress Management: - Provides group verbal and written instruction about the health risks of elevated  stress, cause of high stress, and healthy ways to reduce stress.      Cardiac Rehab from 06/11/2015 in [facility] Card[facility] Rehab   Date  04/30/15   Educator  [facility]   Instruction Review Code  2- meets goals/outcomes      Depression: - Provides group verbal and written instruction on the correlation between heart/lung disease and depressed mood, treatment options, and the stigmas associated with seeking treatment.   Anatomy & Physiology of the Heart: - Group verbal and written instruction and models provide basic cardiac anatomy and physiology, with the coronary electrical and arterial systems. Review of: AMI, Angina, Valve disease, Heart Failure, Cardiac Arrhythmia, Pacemakers, and the ICD.      Cardiac Rehab from 06/11/2015 in [facility] Re[facility]   Date  05/05/15   Educator  S. Vercie Pokorny   Instruction Review Code  2- meets goals/outcomes      Cardiac Procedures: - Group verbal and written instruction and models to describe the testing methods done to diagnose heart disease. Reviews the outcomes  of the test results. Describes the treatment choices: Medical Management, Angioplasty, or Coronary Bypass Surgery.   Cardiac Medications: - Group verbal and written instruction to review commonly prescribed medications for heart disease. Reviews the medication, class of the drug, and side effects. Includes the steps to properly store meds and maintain the prescription regimen.      Cardiac Rehab from 06/11/2015 in [facility] Card[facility] Rehab   Date  06/09/15   Educator  SB   Instruction Review Code  2- meets goals/outcomes      Go Sex-Intimacy & Heart Disease, Get SMART - Goal Setting: - Group verbal and written instruction through game format to discuss heart disease and the return to sexual intimacy. Provides group verbal and written material to discuss and apply goal setting through the application of the S.M.A.R.T. Method.   Other Matters of the Heart: - Provides group verbal, written materials and models to describe Heart Failure, Angina, Valve Disease, and Diabetes in the realm of heart disease. Includes description of the disease process and treatment options available to the cardiac patient.      Cardiac Rehab from 06/11/2015 in [facility] Cardiac Rehab   Date  05/14/15   Educator  SB   Instruction Review Code  2- meets goals/outcomes      Exercise & Equipment Safety: - Individual verbal instruction and demonstration of equipment use and safety with use of the equipment.   Infection Prevention: - Provides verbal and written material to individual with discussion of infection control including proper hand washing and proper equipment cleaning during exercise session.   Falls Prevention: - Provides verbal and written material to individual with discussion of falls prevention and safety.   Diabetes: - Individual verbal and written instruction to review signs/symptoms of diabetes, desired ranges of glucose level fasting, after meals and with exercise. Advice that pre and post exercise  glucose checks will be done for 3 sessions at entry of program.    Knowledge Questionnaire Score:     Knowledge Questionnaire Score - 05/20/15 1043    Knowledge Questionnaire Score   Pre Score 20/28      Personal Goals and Risk Factors at Admission:     Personal Goals and Risk Factors at Admission - 04/20/15 1140    Personal Goals and Risk Factors on Admission   Diabetes No   Hypertension Yes   Goal Participant will see blood pressure controlled within the values of 140/79mm/Hg or within value directed  by their physician.   Intervention Provide nutrition & aerobic exercise along with prescribed medications to achieve BP 140/90 or less.   Lipids Yes   Goal Cholesterol controlled with medications as prescribed, with individualized exercise RX and with personalized nutrition plan. Value goals: LDL < 70mg , HDL > 40mg . Participant states understanding of desired cholesterol values and following prescriptions.      Personal Goals and Risk Factors Review:      Goals and Risk Factor Review      04/18/15 0935 04/22/15 1317 05/02/15 0949 05/16/15 1158 05/19/15 0844   Weight Management   Goals Progress/Improvement seen No  No     Comments HAs not seen any changes yet.  To see RD today.  --  Spoke to Red about his weight and what steps he can take to help decrease his weight.   Talked to him about finding an APP that helps him monitor his calorie/fat intake. He will look into this.     Increase Aerobic Exercise and Physical Activity   Goals Progress/Improvement seen  Yes   Yes Yes   Comments    Continues to work well with exercise progression    Take Less Medication   Goals Progress/Improvement seen No   No No   Comments MD is working on BP control. Expecting medication changes until BP under control   MD is working on BP control. Expecting medication changes until BP under control    Understand more about Heart/Pulmonary Disease   Goals Progress/Improvement seen  Yes   Yes Yes    Comments Attending classes, no questions.   Attending classes, no questions.    Hypertension   Goal  --  Continues to work closely with MD to gain control  of hypertension      Progress seen toward goals   Yes Yes Yes   Comments   --  A few BP readings have been lower this week.  Not consistent yet. He has been controlling sodium intake.  Will now focus on weight management. BP readings for last week have had diastolic under 100    Abnormal Lipids   Goal  --  No labs to see changes        05/19/15 1014 05/20/15 1044 06/13/15 1040       Weight Management   Goals Progress/Improvement seen No No Yes     Comments Given calorie count information per his request. He said he is not losing any weight.  Given calorie count information per his request. He said he is not losing any weight.  Is maintaing his weight and not gaining even though the past 2 weeks have been stressful for him.      Increase Aerobic Exercise and Physical Activity   Goals Progress/Improvement seen  Yes Yes Yes     Comments  Continues to work well with exercise progression He said the treadmill exercisng is helping him. He did his 6 minute walk today with no problems and increased actual feet that he walked when he first started Cardiac Rehab. Plans on joining a gym after Cardiac Rehab.      Take Less Medication   Goals Progress/Improvement seen  No      Comments  MD is working on BP control. Expecting medication changes until BP under control      Understand more about Heart/Pulmonary Disease   Goals Progress/Improvement seen   Yes      Comments  Attending classes, no questions.  Hypertension   Progress seen toward goals  Yes      Comments  BP readings for last week have had diastolic under 100         Personal Goals Discharge:      Personal Goals at Discharge - 07/04/15 0749    Weight Management   Goals Progress/Improvement seen Yes   Comments Maintaining weight.   Increase Aerobic Exercise and Physical  Activity   Goals Progress/Improvement seen  Yes   Comments doing well witn exercise prescription progression.   Take Less Medication   Goals Progress/Improvement seen No   Comments continues to have medication changes for hypertension   Understand more about Heart/Pulmonary Disease   Goals Progress/Improvement seen  Yes   Comments attends education session, willask questions as needed       Comments: 30 day review

## 2015-07-14 ENCOUNTER — Encounter: Payer: BLUE CROSS/BLUE SHIELD | Admitting: *Deleted

## 2015-07-14 DIAGNOSIS — I214 Non-ST elevation (NSTEMI) myocardial infarction: Secondary | ICD-10-CM

## 2015-07-14 NOTE — Patient Instructions (Signed)
Discharge Instructions  Patient Details  Name: Carlos Bryant MRN: [id number] Date of Birth: Feb 05, 1971 Referring Provider:  Chales Abrahams, MD   Number of Visits: 37  Reason for Discharge:  Patient independent in their exercise.  Smoking History:  History  Smoking status  . Not on file  Smokeless tobacco  . Not on file    Diagnosis:  Acute non-ST-elevation myocardial infarction  Initial Exercise Prescription:   Discharge Exercise Prescription (Final Exercise Prescription Changes):     Exercise Prescription Changes - 07/08/15 0700    Exercise Review   Progression No  Do not progress due to the continued angina   Response to Exercise   Blood Pressure (Admit) 146/98 mmHg   Blood Pressure (Exercise) 148/94 mmHg   Blood Pressure (Exit) 128/88 mmHg   Heart Rate (Admit) 71 bpm   Heart Rate (Exercise) 104 bpm   Heart Rate (Exit) 78 bpm   Rating of Perceived Exertion (Exercise) 12   Symptoms Angina with exercise, subsides with rest   Duration Progress to 30 minutes of continuous aerobic without signs/symptoms of physical distress   Intensity Rest + 30  Exercise below the threshold that illicits symptoms    Progression Continue progressive overload as per policy without signs/symptoms or physical distress.   Resistance Training   Training Prescription Yes   Weight 4   Reps 10-15   Interval Training   Interval Training No   Treadmill   MPH 3   Grade 0   Minutes 20   REL-XR   Level 8   Watts 85   Minutes 20      Functional Capacity:     6 Minute Walk      03/11/15 1139 06/13/15 0908 07/04/15 0748   6 Minute Walk   Phase Initial Discharge    Distance 1240 feet 1425 feet 1425 feet   Walk Time 6 minutes 6 minutes 6 minutes   Resting HR 75 bpm 81 bpm 81 bpm   Resting BP 157/103 mmHg 138/90 mmHg 138/90 mmHg   Max Ex. HR 113 bpm 110 bpm 110 bpm   Max Ex. BP 160/106 mmHg 150/98 mmHg 150/98 mmHg   RPE 11 11 11    Symptoms No No       Quality of Life:      Quality of Life - 03/11/15 1141    Quality of Life Scores   Health/Function Pre 28.87 %   Socioeconomic Pre 28.75 %   Psych/Spiritual Pre 30 %   Family Pre 26.4 %   GLOBAL Pre 28.71 %      Personal Goals: Goals established at orientation with interventions provided to work toward goal.     Personal Goals and Risk Factors at Admission - 04/20/15 1140    Personal Goals and Risk Factors on Admission   Diabetes No   Hypertension Yes   Goal Participant will see blood pressure controlled within the values of 140/65mm/Hg or within value directed by their physician.   Intervention Provide nutrition & aerobic exercise along with prescribed medications to achieve BP 140/90 or less.   Lipids Yes   Goal Cholesterol controlled with medications as prescribed, with individualized exercise RX and with personalized nutrition plan. Value goals: LDL < 70mg , HDL > 40mg . Participant states understanding of desired cholesterol values and following prescriptions.       Personal Goals Discharge:     Personal Goals at Discharge - 07/04/15 0749    Weight Management   Goals Progress/Improvement seen Yes  Comments Maintaining weight.   Increase Aerobic Exercise and Physical Activity   Goals Progress/Improvement seen  Yes   Comments doing well witn exercise prescription progression.   Take Less Medication   Goals Progress/Improvement seen No   Comments continues to have medication changes for hypertension   Understand more about Heart/Pulmonary Disease   Goals Progress/Improvement seen  Yes   Comments attends education session, willask questions as needed      Nutrition & Weight - Outcomes:     Pre Biometrics - 04/18/15 1111    Pre Biometrics   Height 6' (1.829 m)   Weight 273 lb 3.2 oz (123.923 kg)   BMI (Calculated) 37.1       Nutrition:     Nutrition Therapy & Goals - 04/18/15 1109    Nutrition Therapy   Diet 2000 calorie meal plan; DASH diet principles   Drug/Food  Interactions Statins/Certain Fruits   Fiber 30 grams   Whole Grain Foods 3 servings   Protein 9 ounces/day   Saturated Fats 13 max. grams   Fruits and Vegetables 5 servings/day   Personal Nutrition Goals   Personal Goal #1 Increase intake of fruits/vegetables with goal of at least 5 servings daily.   Personal Goal #2 To continue to read labels for saturated fat, trans fat and sodium.      Nutrition Discharge:     Rate Your Plate - 13/08/65 1044    Rate Your Plate Scores   Pre Score 73   Pre Score % 81 %      Education Questionnaire Score:     Knowledge Questionnaire Score - 05/20/15 1043    Knowledge Questionnaire Score   Pre Score 20/28      Goals reviewed with patient; copy given to patient.

## 2015-07-14 NOTE — Progress Notes (Signed)
Cardiac Individual Treatment Plan  Patient Details  Name: Carlos Bryant MRN: [id number] Date of Birth: 08-12-1971 Referring Provider:  Chales Abrahams, MD  Initial Encounter Date:    Visit Diagnosis: Acute non-ST-elevation myocardial infarction  Patient's Home Medications on Admission:  Current outpatient prescriptions:  .  aspirin EC 81 MG tablet, Take by mouth., Disp: , Rfl:  .  atorvastatin (LIPITOR) 80 MG tablet, Take by mouth., Disp: , Rfl:  .  carvedilol (COREG) 25 MG tablet, Take 50 mg by mouth 2 (two) times daily. , Disp: , Rfl:  .  cloNIDine (CATAPRES - DOSED IN MG/24 HR) 0.1 mg/24hr patch, Place 0.1 patches onto the skin., Disp: , Rfl:  .  cloNIDine (CATAPRES - DOSED IN MG/24 HR) 0.2 mg/24hr patch, Place 0.2 mg onto the skin once a week., Disp: , Rfl:  .  hydrALAZINE (APRESOLINE) 10 MG tablet, Take 10 mg by mouth 2 (two) times daily., Disp: , Rfl:  .  hydrALAZINE (APRESOLINE) 25 MG tablet, Take 10 mg by mouth. , Disp: , Rfl:  .  losartan (COZAAR) 100 MG tablet, Take by mouth., Disp: , Rfl:  .  metoprolol succinate (TOPROL-XL) 25 MG 24 hr tablet, Take 50 mg by mouth. , Disp: , Rfl:  .  Multiple Vitamin (MULTI-VITAMINS) TABS, Take by mouth., Disp: , Rfl:  .  nitroGLYCERIN (NITROSTAT) 0.4 MG SL tablet, Place under the tongue., Disp: , Rfl:  .  ticagrelor (BRILINTA) 90 MG TABS tablet, Take by mouth 2 (two) times daily., Disp: , Rfl:  .  ticagrelor (BRILINTA) 90 MG TABS tablet, Take by mouth., Disp: , Rfl:   Past Medical History: Past Medical History  Diagnosis Date  . Hypertension     Tobacco Use: History  Smoking status  . Not on file  Smokeless tobacco  . Not on file    Labs: Recent Review Flowsheet Data    There is no flowsheet data to display.       Exercise Target Goals:    Exercise Program Goal: Individual exercise prescription set with THRR, safety & activity barriers. Participant demonstrates ability to understand and report RPE using BORG scale,  to self-measure pulse accurately, and to acknowledge the importance of the exercise prescription.  Exercise Prescription Goal: Starting with aerobic activity 30 plus minutes a day, 3 days per week for initial exercise prescription. Provide home exercise prescription and guidelines that participant acknowledges understanding prior to discharge.  Activity Barriers & Risk Stratification:     Activity Barriers & Risk Stratification - 04/20/15 1138    Activity Barriers & Risk Stratification   Activity Barriers None   Risk Stratification High      6 Minute Walk:     6 Minute Walk      03/11/15 1139 06/13/15 0908 07/04/15 0748   6 Minute Walk   Phase Initial Discharge    Distance 1240 feet 1425 feet 1425 feet   Walk Time 6 minutes 6 minutes 6 minutes   Resting HR 75 bpm 81 bpm 81 bpm   Resting BP 157/103 mmHg 138/90 mmHg 138/90 mmHg   Max Ex. HR 113 bpm 110 bpm 110 bpm   Max Ex. BP 160/106 mmHg 150/98 mmHg 150/98 mmHg   RPE 11 11 11    Symptoms No No       Initial Exercise Prescription:   Exercise Prescription Changes:     Exercise Prescription Changes      04/22/15 0700 05/07/15 0800 05/19/15 1800 06/03/15 0700 07/08/15 0700   Exercise  Review   Progression Yes Yes Yes No  Do not progress due to the continued angina No  Do not progress due to the continued angina   Response to Exercise   Blood Pressure (Admit) 158/118 mmHg  138/84 mmHg 150/100 mmHg 146/98 mmHg   Blood Pressure (Exercise) 180/110 mmHg  158/80 mmHg 178/110 mmHg 148/94 mmHg   Blood Pressure (Exit) 150/90 mmHg  138/84 mmHg 144/98 mmHg 128/88 mmHg   Heart Rate (Admit) 83 bpm  80 bpm 70 bpm 71 bpm   Heart Rate (Exercise) 107 bpm  107 bpm 87 bpm 104 bpm   Heart Rate (Exit) 65 bpm  87 bpm 65 bpm 78 bpm   Rating of Perceived Exertion (Exercise) 12  11 13 12    Symptoms   Angina with exercise, subsides with rest Angina with exercise, subsides with rest Angina with exercise, subsides with rest   Duration Progress to  30 minutes of continuous aerobic without signs/symptoms of physical distress  Progress to 30 minutes of continuous aerobic without signs/symptoms of physical distress Progress to 30 minutes of continuous aerobic without signs/symptoms of physical distress Progress to 30 minutes of continuous aerobic without signs/symptoms of physical distress   Intensity THRR unchanged  Exercise below the threshold that illicits symptoms  --  Exercise below the threshold that illicits symptoms  Rest + 30  Exercise below the threshold that illicits symptoms  Rest + 30  Exercise below the threshold that illicits symptoms  Rest + 30  Exercise below the threshold that illicits symptoms    Progression Continue progressive overload as per policy without signs/symptoms or physical distress.  Continue progressive overload as per policy without signs/symptoms or physical distress. Continue progressive overload as per policy without signs/symptoms or physical distress. Continue progressive overload as per policy without signs/symptoms or physical distress.   Resistance Training   Training Prescription Yes  Yes Yes Yes   Weight 4  4 4 4    Reps 10-12  10-12 10-15 10-15   Interval Training   Interval Training    No No   Treadmill   MPH 3 -- 3 3 3    Grade 0 -- 0 0 0   Minutes 20 -- 20 20 20    Elliptical   Level  --      Speed  --      REL-XR   Level 7 8 8 8 8    Watts 70 85 85 85 85   Minutes 20 -- 20 20 20       Discharge Exercise Prescription (Final Exercise Prescription Changes):     Exercise Prescription Changes - 07/08/15 0700    Exercise Review   Progression No  Do not progress due to the continued angina   Response to Exercise   Blood Pressure (Admit) 146/98 mmHg   Blood Pressure (Exercise) 148/94 mmHg   Blood Pressure (Exit) 128/88 mmHg   Heart Rate (Admit) 71 bpm   Heart Rate (Exercise) 104 bpm   Heart Rate (Exit) 78 bpm   Rating of Perceived Exertion (Exercise) 12   Symptoms Angina with exercise,  subsides with rest   Duration Progress to 30 minutes of continuous aerobic without signs/symptoms of physical distress   Intensity Rest + 30  Exercise below the threshold that illicits symptoms    Progression Continue progressive overload as per policy without signs/symptoms or physical distress.   Resistance Training   Training Prescription Yes   Weight 4   Reps 10-15   Interval Training   Interval  Training No   Treadmill   MPH 3   Grade 0   Minutes 20   REL-XR   Level 8   Watts 85   Minutes 20      Nutrition:  Target Goals: Understanding of nutrition guidelines, daily intake of sodium 1500mg , cholesterol 200mg , calories 30% from fat and 7% or less from saturated fats, daily to have 5 or more servings of fruits and vegetables.  Biometrics:     Pre Biometrics - 04/18/15 1111    Pre Biometrics   Height 6' (1.829 m)   Weight 273 lb 3.2 oz (123.923 kg)   BMI (Calculated) 37.1       Nutrition Therapy Plan and Nutrition Goals:     Nutrition Therapy & Goals - 04/18/15 1109    Nutrition Therapy   Diet 2000 calorie meal plan; DASH diet principles   Drug/Food Interactions Statins/Certain Fruits   Fiber 30 grams   Whole Grain Foods 3 servings   Protein 9 ounces/day   Saturated Fats 13 max. grams   Fruits and Vegetables 5 servings/day   Personal Nutrition Goals   Personal Goal #1 Increase intake of fruits/vegetables with goal of at least 5 servings daily.   Personal Goal #2 To continue to read labels for saturated fat, trans fat and sodium.      Nutrition Discharge: Rate Your Plate Scores:     Rate Your Plate - 16/10/96 1044    Rate Your Plate Scores   Pre Score 73   Pre Score % 81 %      Nutrition Goals Re-Evaluation:     Nutrition Goals Re-Evaluation      05/16/15 1157 05/19/15 0844 05/19/15 1013 05/20/15 1043 06/13/15 1039   Personal Goal #1 Re-Evaluation   Personal Goal #1 Increase intake of fruits/vegetables with goal of at least 5 servings daily.    Increase intake of fruits/vegetables with goal of at least 5 servings daily.    Goal Progress Seen Yes Yes  Yes    Personal Goal #2 Re-Evaluation   Personal Goal #2 To continue to read labels for saturated fat, trans fat and sodium.   To continue to read labels for saturated fat, trans fat and sodium.    Goal Progress Seen Yes Yes Yes Yes Yes   Comments Working at controlling sodium intake as well as the fats  Given calorie counting information per his request. He wants to lose some weight.  Given calorie counting information per his request. He wants to lose some weight.  Has not gained weight. Is watching what he is eating. Discussed today since he is stressed to try to focus on what he is eating when he is chewing.    Intervention Plan   Intervention Continue working on goals with interventions provided in exercise prescription and nutrition intervention sections.         06/17/15 1453           Personal Goal #1 Re-Evaluation   Personal Goal #1 Increase intake of fruits/vegetables with goal of at least 5 servings daily.       Goal Progress Seen Yes       Comments Continues to follow goal guidelines       Personal Goal #2 Re-Evaluation   Personal Goal #2 To continue to read labels for saturated fat, trans fat and sodium.       Goal Progress Seen Yes       Comments Reading labels  Psychosocial: Target Goals: Acknowledge presence or absence of depression, maximize coping skills, provide positive support system. Participant is able to verbalize types and ability to use techniques and skills needed for reducing stress and depression.  Initial Review & Psychosocial Screening:   Quality of Life Scores:     Quality of Life - 03/11/15 1141    Quality of Life Scores   Health/Function Pre 28.87 %   Socioeconomic Pre 28.75 %   Psych/Spiritual Pre 30 %   Family Pre 26.4 %   GLOBAL Pre 28.71 %      PHQ-9:     Recent Review Flowsheet Data    There is no flowsheet data to  display.      Psychosocial Evaluation and Intervention:   Psychosocial Re-Evaluation:   Vocational Rehabilitation: Provide vocational rehab assistance to qualifying candidates.   Vocational Rehab Evaluation & Intervention:     Vocational Rehab - 04/20/15 1138    Initial Vocational Rehab Evaluation & Intervention   Assessment shows need for Vocational Rehabilitation No      Education: Education Goals: Education classes will be provided on a weekly basis, covering required topics. Participant will state understanding/return demonstration of topics presented.  Learning Barriers/Preferences:     Learning Barriers/Preferences - 04/20/15 1138    Learning Barriers/Preferences   Learning Barriers None   Learning Preferences None      Education Topics: General Nutrition Guidelines/Fats and Fiber: -Group instruction provided by verbal, written material, models and posters to present the general guidelines for heart healthy nutrition. Gives an explanation and review of dietary fats and fiber.          Cardiac Rehab from 06/11/2015 in [facility]   Date  05/19/15   Educator  C. Perlie Gold, RD   Instruction Review Code  2- meets goals/outcomes      Controlling Sodium/Reading Food Labels: -Group verbal and written material supporting the discussion of sodium use in heart healthy nutrition. Review and explanation with models, verbal and written materials for utilization of the food label.      Cardiac Rehab from 06/11/2015 in [facility]   Date  05/26/15   Educator  CR   Instruction Review Code  2- meets goals/outcomes      Exercise Physiology & Risk Factors: - Group verbal and written instruction with models to review the exercise physiology of the cardiovascular system and associated critical values. Details cardiovascular disease risk factors and the goals associated with each risk factor.      Cardiac Rehab from 06/11/2015 in [facility] Card[facility] Re[facility]   Date  06/11/15    Educator  RM   Instruction Review Code  2- meets goals/outcomes      Aerobic Exercise & Resistance Training: - Gives group verbal and written discussion on the health impact of inactivity. On the components of aerobic and resistive training programs and the benefits of this training and how to safely progress through these programs.      Cardiac Rehab from 06/11/2015 in [facility] Cardiac Rehab   Date  04/21/15   Educator  Asencion Islam   Instruction Review Code  2- meets goals/outcomes      Flexibility, Balance, General Exercise Guidelines: - Provides group verbal and written instruction on the benefits of flexibility and balance training programs. Provides general exercise guidelines with specific guidelines to those with heart or lung disease. Demonstration and skill practice provided.   Stress Management: - Provides group verbal and written instruction about the health risks of elevated  stress, cause of high stress, and healthy ways to reduce stress.      Cardiac Rehab from 06/11/2015 in [facility]   Date  04/30/15   Educator  [facility]   Instruction Review Code  2- meets goals/outcomes      Depression: - Provides group verbal and written instruction on the correlation between heart/lung disease and depressed mood, treatment options, and the stigmas associated with seeking treatment.   Anatomy & Physiology of the Heart: - Group verbal and written instruction and models provide basic cardiac anatomy and physiology, with the coronary electrical and arterial systems. Review of: AMI, Angina, Valve disease, Heart Failure, Cardiac Arrhythmia, Pacemakers, and the ICD.      Cardiac Rehab from 06/11/2015 in [facility] Cardiac Rehab   Date  05/05/15   Educator  S. Bice   Instruction Review Code  2- meets goals/outcomes      Cardiac Procedures: - Group verbal and written instruction and models to describe the testing methods done to diagnose heart disease. Reviews the outcomes of the test  results. Describes the treatment choices: Medical Management, Angioplasty, or Coronary Bypass Surgery.   Cardiac Medications: - Group verbal and written instruction to review commonly prescribed medications for heart disease. Reviews the medication, class of the drug, and side effects. Includes the steps to properly store meds and maintain the prescription regimen.      Cardiac Rehab from 06/11/2015 in [facility] Cardiac Rehab   Date  06/09/15   Educator  SB   Instruction Review Code  2- meets goals/outcomes      Go Sex-Intimacy & Heart Disease, Get SMART - Goal Setting: - Group verbal and written instruction through game format to discuss heart disease and the return to sexual intimacy. Provides group verbal and written material to discuss and apply goal setting through the application of the S.M.A.R.T. Method.   Other Matters of the Heart: - Provides group verbal, written materials and models to describe Heart Failure, Angina, Valve Disease, and Diabetes in the realm of heart disease. Includes description of the disease process and treatment options available to the cardiac patient.      Cardiac Rehab from 06/11/2015 in [facility] Cardiac Rehab   Date  05/14/15   Educator  SB   Instruction Review Code  2- meets goals/outcomes      Exercise & Equipment Safety: - Individual verbal instruction and demonstration of equipment use and safety with use of the equipment.   Infection Prevention: - Provides verbal and written material to individual with discussion of infection control including proper hand washing and proper equipment cleaning during exercise session.   Falls Prevention: - Provides verbal and written material to individual with discussion of falls prevention and safety.   Diabetes: - Individual verbal and written instruction to review signs/symptoms of diabetes, desired ranges of glucose level fasting, after meals and with exercise. Advice that pre and post exercise glucose checks  will be done for 3 sessions at entry of program.    Knowledge Questionnaire Score:     Knowledge Questionnaire Score - 05/20/15 1043    Knowledge Questionnaire Score   Pre Score 20/28      Personal Goals and Risk Factors at Admission:     Personal Goals and Risk Factors at Admission - 04/20/15 1140    Personal Goals and Risk Factors on Admission   Diabetes No   Hypertension Yes   Goal Participant will see blood pressure controlled within the values of 140/13mm/Hg or within value directed  by their physician.   Intervention Provide nutrition & aerobic exercise along with prescribed medications to achieve BP 140/90 or less.   Lipids Yes   Goal Cholesterol controlled with medications as prescribed, with individualized exercise RX and with personalized nutrition plan. Value goals: LDL < , HDL > . Participant states understanding of desired cholesterol values and following prescriptions.      Personal Goals and Risk Factors Review:      Goals and Risk Factor Review      04/18/15 0935 04/22/15 1317 05/02/15 0949 05/16/15 1158 05/19/15 0844   Weight Management   Goals Progress/Improvement seen No  No     Comments HAs not seen any changes yet.  To see RD today.  --  Spoke to Red about his weight and what steps he can take to help decrease his weight.   Talked to him about finding an APP that helps him monitor his calorie/fat intake. He will look into this.     Increase Aerobic Exercise and Physical Activity   Goals Progress/Improvement seen  Yes   Yes Yes   Comments    Continues to work well with exercise progression    Take Less Medication   Goals Progress/Improvement seen No   No No   Comments MD is working on BP control. Expecting medication changes until BP under control   MD is working on BP control. Expecting medication changes until BP under control    Understand more about Heart/Pulmonary Disease   Goals Progress/Improvement seen  Yes   Yes Yes   Comments Attending  classes, no questions.   Attending classes, no questions.    Hypertension   Goal  --  Continues to work closely with MD to gain control  of hypertension      Progress seen toward goals   Yes Yes Yes   Comments   --  A few BP readings have been lower this week.  Not consistent yet. He has been controlling sodium intake.  Will now focus on weight management. BP readings for last week have had diastolic under 100    Abnormal Lipids   Goal  --  No labs to see changes        05/19/15 1014 05/20/15 1044 06/13/15 1040       Weight Management   Goals Progress/Improvement seen No No Yes     Comments Given calorie count information per his request. He said he is not losing any weight.  Given calorie count information per his request. He said he is not losing any weight.  Is maintaing his weight and not gaining even though the past 2 weeks have been stressful for him.      Increase Aerobic Exercise and Physical Activity   Goals Progress/Improvement seen  Yes Yes Yes     Comments  Continues to work well with exercise progression He said the treadmill exercisng is helping him. He did his 6 minute walk today with no problems and increased actual feet that he walked when he first started Cardiac Rehab. Plans on joining a gym after Cardiac Rehab.      Take Less Medication   Goals Progress/Improvement seen  No      Comments  MD is working on BP control. Expecting medication changes until BP under control      Understand more about Heart/Pulmonary Disease   Goals Progress/Improvement seen   Yes      Comments  Attending classes, no questions.  Hypertension   Progress seen toward goals  Yes      Comments  BP readings for last week have had diastolic under 100         Personal Goals Discharge:      Personal Goals at Discharge - 07/04/15 0749    Weight Management   Goals Progress/Improvement seen Yes   Comments Maintaining weight.   Increase Aerobic Exercise and Physical Activity   Goals  Progress/Improvement seen  Yes   Comments doing well witn exercise prescription progression.   Take Less Medication   Goals Progress/Improvement seen No   Comments continues to have medication changes for hypertension   Understand more about Heart/Pulmonary Disease   Goals Progress/Improvement seen  Yes   Comments attends education session, willask questions as needed       Comments: I sent a fax to Dr. Chales Abrahams at [facility] and Primary Care MD Dr. Bethann Punches since "Red" Luvenia Heller had to stop every 3 minutes for 3/10 chest pain on the treadmill which resolved with rest several times. Today Isaish finished 36/36 Cardiac Rehab sessions. To go to a local gym.

## 2015-07-14 NOTE — Progress Notes (Signed)
Daily Session Note  Patient Details  Name: Carlos Bryant MRN: [id number] Date of Birth: 11/15/1971 Referring Provider:  Dionne Ano, MD  Encounter Date: 07/14/2015  Check In:     Session Check In - 07/14/15 0850    Check-In   Staff Present Earlean Shawl BS, ACSM CEP Exercise Physiologist;Carroll Enterkin RN, BSN;Steven Way BS, ACSM EP-C, Exercise Physiologist   ER physicians immediately available to respond to emergencies See telemetry face sheet for immediately available ER MD   Medication changes reported     Yes   Comments Discontinue Losartan/ Added Verapamil 100 mg   Fall or balance concerns reported    No   Warm-up and Cool-down Performed on first and last piece of equipment   VAD Patient? No   Pain Assessment   Currently in Pain? Yes   Pain Score 3    Pain Location Chest   Pain Type Acute pain   Pain Frequency Intermittent   Aggravating Factors  Onset with exercise (walking on TM). Relieved with rest.          Goals Met:  Independence with exercise equipment Personal goals reviewed  Goals Unmet:  Not Applicable  Goals Comments:    [name] is Medical Director for [place] and [facility].

## 2015-07-16 ENCOUNTER — Other Ambulatory Visit: Payer: Self-pay | Admitting: *Deleted

## 2015-07-16 DIAGNOSIS — I214 Non-ST elevation (NSTEMI) myocardial infarction: Secondary | ICD-10-CM

## 2015-07-16 NOTE — Progress Notes (Signed)
Cardiac Individual Treatment Plan  Patient Details  Name: Carlos Bryant MRN: [id number] Date of Birth: Sep 30, 1971 Referring Provider:  Chales Abrahams, MD  Initial Encounter Date:    Visit Diagnosis: Acute non-ST-elevation myocardial infarction - Plan: CARDIAC REHAB 30 DAY REVIEW  Patient's Home Medications on Admission:  Current outpatient prescriptions:  .  aspirin EC 81 MG tablet, Take by mouth., Disp: , Rfl:  .  atorvastatin (LIPITOR) 80 MG tablet, Take by mouth., Disp: , Rfl:  .  carvedilol (COREG) 25 MG tablet, Take 50 mg by mouth 2 (two) times daily. , Disp: , Rfl:  .  cloNIDine (CATAPRES - DOSED IN MG/24 HR) 0.1 mg/24hr patch, Place 0.1 patches onto the skin., Disp: , Rfl:  .  cloNIDine (CATAPRES - DOSED IN MG/24 HR) 0.2 mg/24hr patch, Place 0.2 mg onto the skin once a week., Disp: , Rfl:  .  hydrALAZINE (APRESOLINE) 10 MG tablet, Take 10 mg by mouth 2 (two) times daily., Disp: , Rfl:  .  hydrALAZINE (APRESOLINE) 25 MG tablet, Take 10 mg by mouth. , Disp: , Rfl:  .  losartan (COZAAR) 100 MG tablet, Take by mouth., Disp: , Rfl:  .  metoprolol succinate (TOPROL-XL) 25 MG 24 hr tablet, Take 50 mg by mouth. , Disp: , Rfl:  .  Multiple Vitamin (MULTI-VITAMINS) TABS, Take by mouth., Disp: , Rfl:  .  nitroGLYCERIN (NITROSTAT) 0.4 MG SL tablet, Place under the tongue., Disp: , Rfl:  .  ticagrelor (BRILINTA) 90 MG TABS tablet, Take by mouth 2 (two) times daily., Disp: , Rfl:  .  ticagrelor (BRILINTA) 90 MG TABS tablet, Take by mouth., Disp: , Rfl:   Past Medical History: Past Medical History  Diagnosis Date  . Hypertension     Tobacco Use: History  Smoking status  . Not on file  Smokeless tobacco  . Not on file    Labs: Recent Review Flowsheet Data    There is no flowsheet data to display.       Exercise Target Goals:    Exercise Program Goal: Individual exercise prescription set with THRR, safety & activity barriers. Participant demonstrates ability to  understand and report RPE using BORG scale, to self-measure pulse accurately, and to acknowledge the importance of the exercise prescription.  Exercise Prescription Goal: Starting with aerobic activity 30 plus minutes a day, 3 days per week for initial exercise prescription. Provide home exercise prescription and guidelines that participant acknowledges understanding prior to discharge.  Activity Barriers & Risk Stratification:     Activity Barriers & Risk Stratification - 04/20/15 1138    Activity Barriers & Risk Stratification   Activity Barriers None   Risk Stratification High      6 Minute Walk:     6 Minute Walk      03/11/15 1139 06/13/15 0908 07/04/15 0748   6 Minute Walk   Phase Initial Discharge    Distance 1240 feet 1425 feet 1425 feet   Walk Time 6 minutes 6 minutes 6 minutes   Resting HR 75 bpm 81 bpm 81 bpm   Resting BP 157/103 mmHg 138/90 mmHg 138/90 mmHg   Max Ex. HR 113 bpm 110 bpm 110 bpm   Max Ex. BP 160/106 mmHg 150/98 mmHg 150/98 mmHg   RPE Symptoms No No       Initial Exercise Prescription:   Exercise Prescription Changes:     Exercise Prescription Changes      04/22/15 0700 05/07/15 0800 05/19/15 1800  06/03/15 0700 07/08/15 0700   Exercise Review   Progression Yes Yes Yes No  Do not progress due to the continued angina No  Do not progress due to the continued angina   Response to Exercise   Blood Pressure (Admit) 158/118 mmHg  138/84 mmHg 150/100 mmHg 146/98 mmHg   Blood Pressure (Exercise) 180/110 mmHg  158/80 mmHg 178/110 mmHg 148/94 mmHg   Blood Pressure (Exit) 150/90 mmHg  138/84 mmHg 144/98 mmHg 128/88 mmHg   Heart Rate (Admit) 83 bpm  80 bpm 70 bpm 71 bpm   Heart Rate (Exercise) 107 bpm  107 bpm 87 bpm 104 bpm   Heart Rate (Exit) 65 bpm  87 bpm 65 bpm 78 bpm   Rating of Perceived Exertion (Exercise) 12  11 13 12    Symptoms   Angina with exercise, subsides with rest Angina with exercise, subsides with rest Angina with  exercise, subsides with rest   Duration Progress to 30 minutes of continuous aerobic without signs/symptoms of physical distress  Progress to 30 minutes of continuous aerobic without signs/symptoms of physical distress Progress to 30 minutes of continuous aerobic without signs/symptoms of physical distress Progress to 30 minutes of continuous aerobic without signs/symptoms of physical distress   Intensity THRR unchanged  Exercise below the threshold that illicits symptoms  --  Exercise below the threshold that illicits symptoms  Rest + 30  Exercise below the threshold that illicits symptoms  Rest + 30  Exercise below the threshold that illicits symptoms  Rest + 30  Exercise below the threshold that illicits symptoms    Progression Continue progressive overload as per policy without signs/symptoms or physical distress.  Continue progressive overload as per policy without signs/symptoms or physical distress. Continue progressive overload as per policy without signs/symptoms or physical distress. Continue progressive overload as per policy without signs/symptoms or physical distress.   Resistance Training   Training Prescription Yes  Yes Yes Yes   Weight 4  4 4 4    Reps 10-12  10-12 10-15 10-15   Interval Training   Interval Training    No No   Treadmill   MPH 3 -- 3 3 3    Grade 0 -- 0 0 0   Minutes 20 -- 20 20 20    Elliptical   Level  --      Speed  --      REL-XR   Level 7 8 8 8 8    Watts 70 85 85 85 85   Minutes 20 -- 20 20 20       Discharge Exercise Prescription (Final Exercise Prescription Changes):     Exercise Prescription Changes - 07/08/15 0700    Exercise Review   Progression No  Do not progress due to the continued angina   Response to Exercise   Blood Pressure (Admit) 146/98 mmHg   Blood Pressure (Exercise) 148/94 mmHg   Blood Pressure (Exit) 128/88 mmHg   Heart Rate (Admit) 71 bpm   Heart Rate (Exercise) 104 bpm   Heart Rate (Exit) 78 bpm   Rating of Perceived  Exertion (Exercise) 12   Symptoms Angina with exercise, subsides with rest   Duration Progress to 30 minutes of continuous aerobic without signs/symptoms of physical distress   Intensity Rest + 30  Exercise below the threshold that illicits symptoms    Progression Continue progressive overload as per policy without signs/symptoms or physical distress.   Resistance Training   Training Prescription Yes   Weight 4   Reps 10-15  Interval Training   Interval Training No   Treadmill   MPH 3   Grade 0   Minutes 20   REL-XR   Level 8   Watts 85   Minutes 20      Nutrition:  Target Goals: Understanding of nutrition guidelines, daily intake of sodium 1500mg , cholesterol 200mg , calories 30% from fat and 7% or less from saturated fats, daily to have 5 or more servings of fruits and vegetables.  Biometrics:     Pre Biometrics - 04/18/15 1111    Pre Biometrics   Height 6' (1.829 m)   Weight 273 lb 3.2 oz (123.923 kg)   BMI (Calculated) 37.1       Nutrition Therapy Plan and Nutrition Goals:     Nutrition Therapy & Goals - 04/18/15 1109    Nutrition Therapy   Diet 2000 calorie meal plan; DASH diet principles   Drug/Food Interactions Statins/Certain Fruits   Fiber 30 grams   Whole Grain Foods 3 servings   Protein 9 ounces/day   Saturated Fats 13 max. grams   Fruits and Vegetables 5 servings/day   Personal Nutrition Goals   Personal Goal #1 Increase intake of fruits/vegetables with goal of at least 5 servings daily.   Personal Goal #2 To continue to read labels for saturated fat, trans fat and sodium.      Nutrition Discharge: Rate Your Plate Scores:     Rate Your Plate - 40/98/11 1044    Rate Your Plate Scores   Pre Score 73   Pre Score % 81 %      Nutrition Goals Re-Evaluation:     Nutrition Goals Re-Evaluation      05/16/15 1157 05/19/15 0844 05/19/15 1013 05/20/15 1043 06/13/15 1039   Personal Goal #1 Re-Evaluation   Personal Goal #1 Increase intake of  fruits/vegetables with goal of at least 5 servings daily.   Increase intake of fruits/vegetables with goal of at least 5 servings daily.    Goal Progress Seen Yes Yes  Yes    Personal Goal #2 Re-Evaluation   Personal Goal #2 To continue to read labels for saturated fat, trans fat and sodium.   To continue to read labels for saturated fat, trans fat and sodium.    Goal Progress Seen Yes Yes Yes Yes Yes   Comments Working at controlling sodium intake as well as the fats  Given calorie counting information per his request. He wants to lose some weight.  Given calorie counting information per his request. He wants to lose some weight.  Has not gained weight. Is watching what he is eating. Discussed today since he is stressed to try to focus on what he is eating when he is chewing.    Intervention Plan   Intervention Continue working on goals with interventions provided in exercise prescription and nutrition intervention sections.         06/17/15 1453 07/16/15 1057         Personal Goal #1 Re-Evaluation   Personal Goal #1 Increase intake of fruits/vegetables with goal of at least 5 servings daily. Doing well with his eating even though he has been stressed due to his brother's death.       Goal Progress Seen Yes Yes      Comments Continues to follow goal guidelines       Personal Goal #2 Re-Evaluation   Personal Goal #2 To continue to read labels for saturated fat, trans fat and sodium.  Goal Progress Seen Yes       Comments Reading labels          Psychosocial: Target Goals: Acknowledge presence or absence of depression, maximize coping skills, provide positive support system. Participant is able to verbalize types and ability to use techniques and skills needed for reducing stress and depression.  Initial Review & Psychosocial Screening:   Quality of Life Scores:     Quality of Life - 03/11/15 1141    Quality of Life Scores   Health/Function Pre 28.87 %   Socioeconomic Pre 28.75  %   Psych/Spiritual Pre 30 %   Family Pre 26.4 %   GLOBAL Pre 28.71 %      PHQ-9:     Recent Review Flowsheet Data    There is no flowsheet data to display.      Psychosocial Evaluation and Intervention:   Psychosocial Re-Evaluation:   Vocational Rehabilitation: Provide vocational rehab assistance to qualifying candidates.   Vocational Rehab Evaluation & Intervention:     Vocational Rehab - 04/20/15 1138    Initial Vocational Rehab Evaluation & Intervention   Assessment shows need for Vocational Rehabilitation No      Education: Education Goals: Education classes will be provided on a weekly basis, covering required topics. Participant will state understanding/return demonstration of topics presented.  Learning Barriers/Preferences:     Learning Barriers/Preferences - 04/20/15 1138    Learning Barriers/Preferences   Learning Barriers None   Learning Preferences None      Education Topics: General Nutrition Guidelines/Fats and Fiber: -Group instruction provided by verbal, written material, models and posters to present the general guidelines for heart healthy nutrition. Gives an explanation and review of dietary fats and fiber.          Cardiac Rehab from 06/11/2015 in [facility] Cardiac Rehab   Date  05/19/15   Educator  C. Perlie Gold, RD   Instruction Review Code  2- meets goals/outcomes      Controlling Sodium/Reading Food Labels: -Group verbal and written material supporting the discussion of sodium use in heart healthy nutrition. Review and explanation with models, verbal and written materials for utilization of the food label.      Cardiac Rehab from 06/11/2015 in [facility] Cardiac Rehab   Date  05/26/15   Educator  CR   Instruction Review Code  2- meets goals/outcomes      Exercise Physiology & Risk Factors: - Group verbal and written instruction with models to review the exercise physiology of the cardiovascular system and associated critical values. Details  cardiovascular disease risk factors and the goals associated with each risk factor.      Cardiac Rehab from 06/11/2015 in [facility] Cardiac Rehab   Date  06/11/15   Educator  RM   Instruction Review Code  2- meets goals/outcomes      Aerobic Exercise & Resistance Training: - Gives group verbal and written discussion on the health impact of inactivity. On the components of aerobic and resistive training programs and the benefits of this training and how to safely progress through these programs.      Cardiac Rehab from 06/11/2015 in [facility] Cardiac Rehab   Date  04/21/15   Educator  Asencion Islam   Instruction Review Code  2- meets goals/outcomes      Flexibility, Balance, General Exercise Guidelines: - Provides group verbal and written instruction on the benefits of flexibility and balance training programs. Provides general exercise guidelines with specific guidelines to those with heart or lung disease.  Demonstration and skill practice provided.   Stress Management: - Provides group verbal and written instruction about the health risks of elevated stress, cause of high stress, and healthy ways to reduce stress.      Cardiac Rehab from 06/11/2015 in [facility] Cardiac Rehab   Date  04/30/15   Educator  [facility]   Instruction Review Code  2- meets goals/outcomes      Depression: - Provides group verbal and written instruction on the correlation between heart/lung disease and depressed mood, treatment options, and the stigmas associated with seeking treatment.   Anatomy & Physiology of the Heart: - Group verbal and written instruction and models provide basic cardiac anatomy and physiology, with the coronary electrical and arterial systems. Review of: AMI, Angina, Valve disease, Heart Failure, Cardiac Arrhythmia, Pacemakers, and the ICD.      Cardiac Rehab from 06/11/2015 in [facility] Cardiac Rehab   Date  05/05/15   Educator  S. Bice   Instruction Review Code  2- meets goals/outcomes      Cardiac  Procedures: - Group verbal and written instruction and models to describe the testing methods done to diagnose heart disease. Reviews the outcomes of the test results. Describes the treatment choices: Medical Management, Angioplasty, or Coronary Bypass Surgery.   Cardiac Medications: - Group verbal and written instruction to review commonly prescribed medications for heart disease. Reviews the medication, class of the drug, and side effects. Includes the steps to properly store meds and maintain the prescription regimen.      Cardiac Rehab from 06/11/2015 in [facility] Cardiac Rehab   Date  06/09/15   Educator  SB   Instruction Review Code  2- meets goals/outcomes      Go Sex-Intimacy & Heart Disease, Get SMART - Goal Setting: - Group verbal and written instruction through game format to discuss heart disease and the return to sexual intimacy. Provides group verbal and written material to discuss and apply goal setting through the application of the S.M.A.R.T. Method.   Other Matters of the Heart: - Provides group verbal, written materials and models to describe Heart Failure, Angina, Valve Disease, and Diabetes in the realm of heart disease. Includes description of the disease process and treatment options available to the cardiac patient.      Cardiac Rehab from 06/11/2015 in [facility] Cardiac Rehab   Date  05/14/15   Educator  SB   Instruction Review Code  2- meets goals/outcomes      Exercise & Equipment Safety: - Individual verbal instruction and demonstration of equipment use and safety with use of the equipment.   Infection Prevention: - Provides verbal and written material to individual with discussion of infection control including proper hand washing and proper equipment cleaning during exercise session.   Falls Prevention: - Provides verbal and written material to individual with discussion of falls prevention and safety.   Diabetes: - Individual verbal and written instruction to  review signs/symptoms of diabetes, desired ranges of glucose level fasting, after meals and with exercise. Advice that pre and post exercise glucose checks will be done for 3 sessions at entry of program.    Knowledge Questionnaire Score:     Knowledge Questionnaire Score - 05/20/15 1043    Knowledge Questionnaire Score   Pre Score 20/28      Personal Goals and Risk Factors at Admission:     Personal Goals and Risk Factors at Admission - 04/20/15 1140    Personal Goals and Risk Factors on Admission   Diabetes No  Hypertension Yes   Goal Participant will see blood pressure controlled within the values of 140/33mm/Hg or within value directed by their physician.   Intervention Provide nutrition & aerobic exercise along with prescribed medications to achieve BP 140/90 or less.   Lipids Yes   Goal Cholesterol controlled with medications as prescribed, with individualized exercise RX and with personalized nutrition plan. Value goals: LDL < 70mg , HDL > 40mg . Participant states understanding of desired cholesterol values and following prescriptions.      Personal Goals and Risk Factors Review:      Goals and Risk Factor Review      04/18/15 0935 04/22/15 1317 05/02/15 0949 05/16/15 1158 05/19/15 0844   Weight Management   Goals Progress/Improvement seen No  No     Comments HAs not seen any changes yet.  To see RD today.  --  Spoke to Red about his weight and what steps he can take to help decrease his weight.   Talked to him about finding an APP that helps him monitor his calorie/fat intake. He will look into this.     Increase Aerobic Exercise and Physical Activity   Goals Progress/Improvement seen  Yes   Yes Yes   Comments    Continues to work well with exercise progression    Take Less Medication   Goals Progress/Improvement seen No   No No   Comments MD is working on BP control. Expecting medication changes until BP under control   MD is working on BP control. Expecting  medication changes until BP under control    Understand more about Heart/Pulmonary Disease   Goals Progress/Improvement seen  Yes   Yes Yes   Comments Attending classes, no questions.   Attending classes, no questions.    Hypertension   Goal  --  Continues to work closely with MD to gain control  of hypertension      Progress seen toward goals   Yes Yes Yes   Comments   --  A few BP readings have been lower this week.  Not consistent yet. He has been controlling sodium intake.  Will now focus on weight management. BP readings for last week have had diastolic under 100    Abnormal Lipids   Goal  --  No labs to see changes        05/19/15 1014 05/20/15 1044 06/13/15 1040 07/16/15 1057     Weight Management   Goals Progress/Improvement seen No No Yes     Comments Given calorie count information per his request. He said he is not losing any weight.  Given calorie count information per his request. He said he is not losing any weight.  Is maintaing his weight and not gaining even though the past 2 weeks have been stressful for him.      Increase Aerobic Exercise and Physical Activity   Goals Progress/Improvement seen  Yes Yes Yes Yes    Comments  Continues to work well with exercise progression He said the treadmill exercisng is helping him. He did his 6 minute walk today with no problems and increased actual feet that he walked when he first started Cardiac Rehab. Plans on joining a gym after Cardiac Rehab.      Take Less Medication   Goals Progress/Improvement seen  No      Comments  MD is working on BP control. Expecting medication changes until BP under control      Understand more about Heart/Pulmonary Disease   Goals Progress/Improvement  seen   Yes  Yes    Comments  Attending classes, no questions.      Hypertension   Goal    Participant will see blood pressure controlled within the values of 140/3mm/Hg or within value directed by their physician.    Progress seen toward goals  Yes  No     Comments  BP readings for last week have had diastolic under 100  Not always has a follow up appt with Dr. Hyacinth Meeker,.        Personal Goals Discharge:      Personal Goals at Discharge - 07/04/15 0749    Weight Management   Goals Progress/Improvement seen Yes   Comments Maintaining weight.   Increase Aerobic Exercise and Physical Activity   Goals Progress/Improvement seen  Yes   Comments doing well witn exercise prescription progression.   Take Less Medication   Goals Progress/Improvement seen No   Comments continues to have medication changes for hypertension   Understand more about Heart/Pulmonary Disease   Goals Progress/Improvement seen  Yes   Comments attends education session, willask questions as needed       Comments: Discharged on 07/14/2015 after completing 36/36 Cardiac Rehab sessions. 30 day review also.

## 2015-07-18 NOTE — Progress Notes (Signed)
Discharge Summary  Patient Details  Name: Carlos Bryant MRN: [id number] Date of Birth: 12/04/1970 Referring Provider:  Chales Abrahams, MD   Number of Visits: 34  Reason for Discharge:  Patient reached a stable level of exercise. Patient independent in their exercise.  Smoking History:  History  Smoking status  . Not on file  Smokeless tobacco  . Not on file    Diagnosis:  Acute non-ST-elevation myocardial infarction - Plan: CARDIAC REHAB 30 DAY REVIEW  ADL UCSD:   Initial Exercise Prescription:   Discharge Exercise Prescription (Final Exercise Prescription Changes):     Exercise Prescription Changes - 07/08/15 0700    Exercise Review   Progression No  Do not progress due to the continued angina   Response to Exercise   Blood Pressure (Admit) 146/98 mmHg   Blood Pressure (Exercise) 148/94 mmHg   Blood Pressure (Exit) 128/88 mmHg   Heart Rate (Admit) 71 bpm   Heart Rate (Exercise) 104 bpm   Heart Rate (Exit) 78 bpm   Rating of Perceived Exertion (Exercise) 12   Symptoms Angina with exercise, subsides with rest   Duration Progress to 30 minutes of continuous aerobic without signs/symptoms of physical distress   Intensity Rest + 30  Exercise below the threshold that illicits symptoms    Progression Continue progressive overload as per policy without signs/symptoms or physical distress.   Resistance Training   Training Prescription Yes   Weight 4   Reps 10-15   Interval Training   Interval Training No   Treadmill   MPH 3   Grade 0   Minutes 20   REL-XR   Level 8   Watts 85   Minutes 20      Functional Capacity:     6 Minute Walk      03/11/15 1139 06/13/15 0908 07/04/15 0748   6 Minute Walk   Phase Initial Discharge    Distance 1240 feet 1425 feet 1425 feet   Walk Time 6 minutes 6 minutes 6 minutes   Resting HR 75 bpm 81 bpm 81 bpm   Resting BP 157/103 mmHg 138/90 mmHg 138/90 mmHg   Max Ex. HR 113 bpm 110 bpm 110 bpm   Max Ex. BP 160/106  mmHg 150/98 mmHg 150/98 mmHg   RPE Symptoms No No       Psychological, QOL, Others - Outcomes: PHQ 2/9: No flowsheet data found.  Quality of Life:     Quality of Life - 03/11/15 1141    Quality of Life Scores   Health/Function Pre 28.87 %   Socioeconomic Pre 28.75 %   Psych/Spiritual Pre 30 %   Family Pre 26.4 %   GLOBAL Pre 28.71 %      Personal Goals: Goals established at orientation with interventions provided to work toward goal.     Personal Goals and Risk Factors at Admission - 04/20/15 1140    Personal Goals and Risk Factors on Admission   Diabetes No   Hypertension Yes   Goal Participant will see blood pressure controlled within the values of 140/76mm/Hg or within value directed by their physician.   Intervention Provide nutrition & aerobic exercise along with prescribed medications to achieve BP 140/90 or less.   Lipids Yes   Goal Cholesterol controlled with medications as prescribed, with individualized exercise RX and with personalized nutrition plan. Value goals: LDL < , HDL > . Participant states understanding of desired cholesterol values and following prescriptions.  Personal Goals Discharge:     Personal Goals at Discharge - 07/18/15 0647    Weight Management   Goals Progress/Improvement seen Yes   Increase Aerobic Exercise and Physical Activity   Goals Progress/Improvement seen  Yes   Take Less Medication   Goals Progress/Improvement seen No   Understand more about Heart/Pulmonary Disease   Goals Progress/Improvement seen  Yes      Nutrition & Weight - Outcomes:     Pre Biometrics - 04/18/15 1111    Pre Biometrics   Height 6' (1.829 m)   Weight 273 lb 3.2 oz (123.923 kg)   BMI (Calculated) 37.1       Nutrition:     Nutrition Therapy & Goals - 04/18/15 1109    Nutrition Therapy   Diet 2000 calorie meal plan; DASH diet principles   Drug/Food Interactions Statins/Certain Fruits   Fiber 30 grams   Whole  Grain Foods 3 servings   Protein 9 ounces/day   Saturated Fats 13 max. grams   Fruits and Vegetables 5 servings/day   Personal Nutrition Goals   Personal Goal #1 Increase intake of fruits/vegetables with goal of at least 5 servings daily.   Personal Goal #2 To continue to read labels for saturated fat, trans fat and sodium.      Nutrition Discharge:     Rate Your Plate - 16/10/96 1044    Rate Your Plate Scores   Pre Score 73   Pre Score % 81 %      Education Questionnaire Score:     Knowledge Questionnaire Score - 05/20/15 1043    Knowledge Questionnaire Score   Pre Score 20/28      Goals reviewed with patient; copy given to patient.

## 2015-07-18 NOTE — Addendum Note (Signed)
Addended by: Virgina Organ on: 07/18/2015 06:51 AM   Modules accepted: Orders

## 2016-01-06 IMAGING — CT CT ANGIO AORTIC DISSECTION W/ CM
3 of 6 series · 5 of 16 positions shown, 6 images · IV contrast (APPLIED)
Comparison: Chest x-ray 01/15/2015

CLINICAL DATA: 44-year-old male with sudden onset sharp, stabbing
pain to the mid sternum radiating into the back.

EXAM:
CT ANGIOGRAPHY CHEST WITH CONTRAST
TECHNIQUE: Multidetector CT imaging of the chest was performed using the
standard protocol during bolus administration of intravenous
contrast. Multiplanar CT image reconstructions and MIPs were
obtained to evaluate the vascular anatomy.
CONTRAST:  100 mL Omnipaque 350 administered intravenously

[Series 6: arterial · axial · arterial · 0.72mm/px · z∈[-326,-22]mm · 3 of 153 slices shown, 4 images]
[im 1/153  soft-tissue]
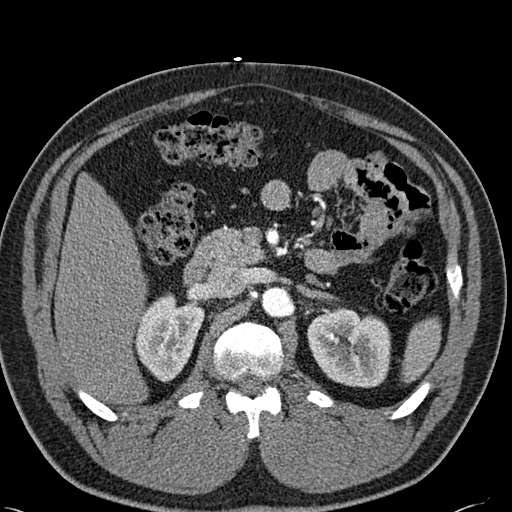
[im 1/153  bone]
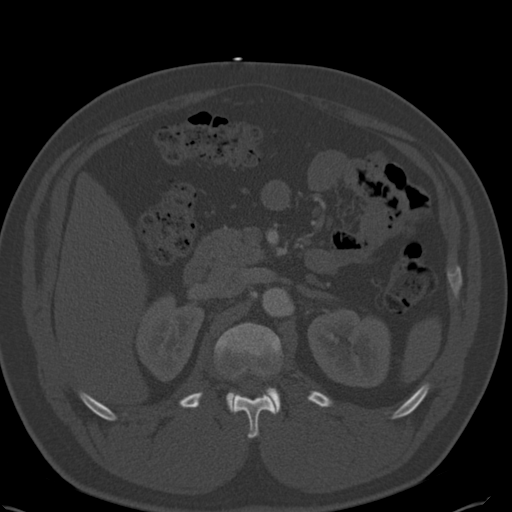
[im 77/153  soft-tissue]
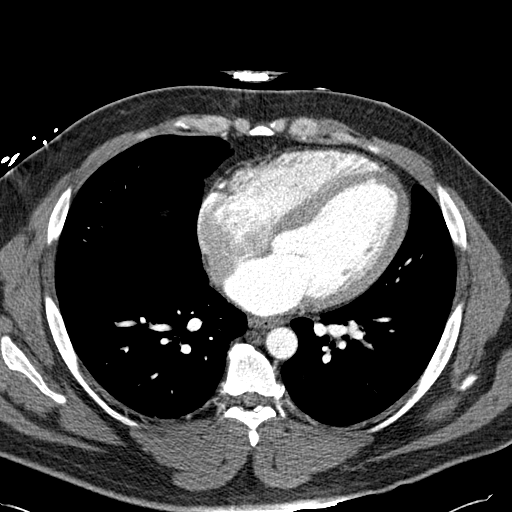
[im 153/153  soft-tissue]
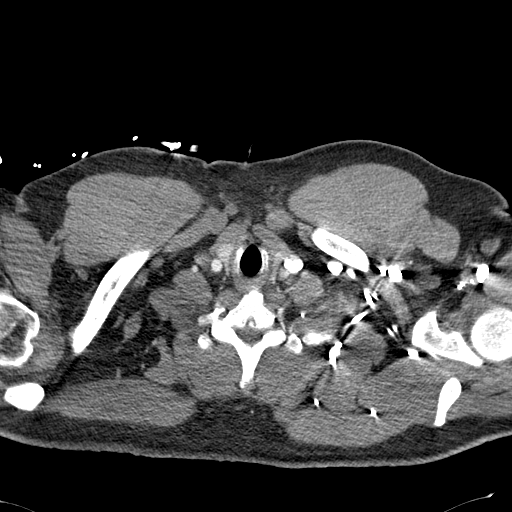

[Series 8: cor arterial mpr · coronal · arterial · 0.60mm/px · 1 of 153 slices shown]
[im 77/153  soft-tissue]
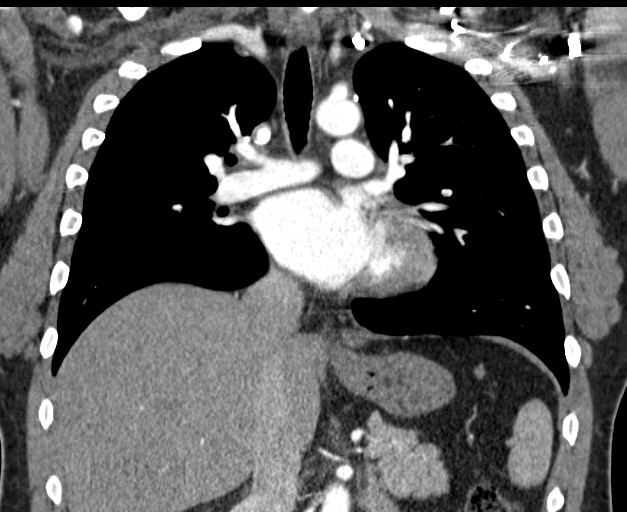

[Series 9: sag arterial mpr · sagittal · arterial · 0.62mm/px · 1 of 182 slices shown]
[im 91/182  soft-tissue]
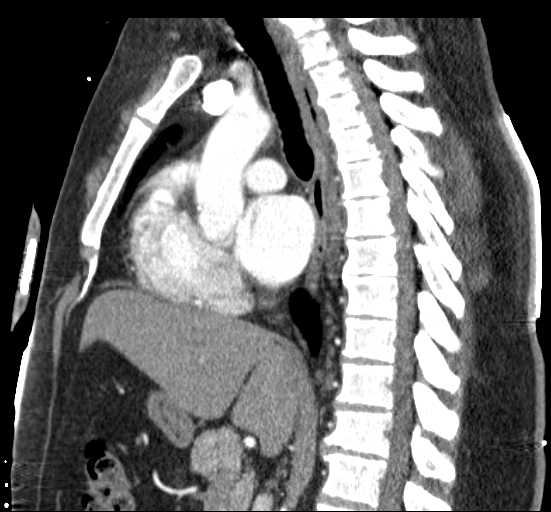

[5 of 16 positions shown; findings below may reference images not displayed]

FINDINGS: Mediastinum: Unremarkable CT appearance of the thyroid gland. No
suspicious mediastinal or hilar adenopathy. No soft tissue
mediastinal mass. The thoracic esophagus is unremarkable.

Heart/Vascular: No evidence of acute intramural hematoma on the
initial non contrasted images. Conventional 3 vessel arch anatomy.
No evidence of aneurysmal dilatation or acute dissection. Scattered
atherosclerotic vascular calcifications are identified along the
course of the left anterior descending and right coronary arteries.
The heart is within normal limits for size. There is no pericardial
effusion. Left atrial appendage is well opacified. No internal
thrombus. Normal caliber main and central pulmonary arteries. No
central PE.

Lungs/Pleura: The lungs are clear.  No pleural effusion.

Bones/Soft Tissues: No acute fracture or aggressive appearing lytic
or blastic osseous lesion.

Upper Abdomen: Visualized upper abdominal organs are unremarkable.

Review of the MIP images confirms the above findings.
IMPRESSION: Negative for acute PE, pneumonia or other acute cardiopulmonary
process.

Atherosclerotic vascular calcifications identified along the course
of the left anterior descending and right coronary arteries. Please
note that although the presence of coronary artery calcium documents
the presence of coronary artery disease, the severity of this
disease and any potential stenosis cannot be assessed on this
non-gated CT examination. Assessment for potential risk factor
modification, dietary therapy or pharmacologic therapy may be
warranted, if clinically indicated.

## 2016-06-28 IMAGING — CT CT HEAD WO/W CM
1 of 2 series · 13 of 30 positions shown, 17 images · IV contrast (omnipaque)
Comparison: None.

CLINICAL DATA: Chronic cluster headaches with left eye drooping for
3 months.

EXAM:
CT HEAD WITHOUT AND WITH CONTRAST
TECHNIQUE: Contiguous axial images were obtained from the base of the skull
through the vertex without and with intravenous contrast
CONTRAST:  60mL OMNIPAQUE IOHEXOL 300 MG/ML  SOLN

[Series 2: head wo · axial · 0.42mm/px · z∈[-147,-27]mm · 13 of 30 slices shown, 17 images]
[im 3/30  brain]
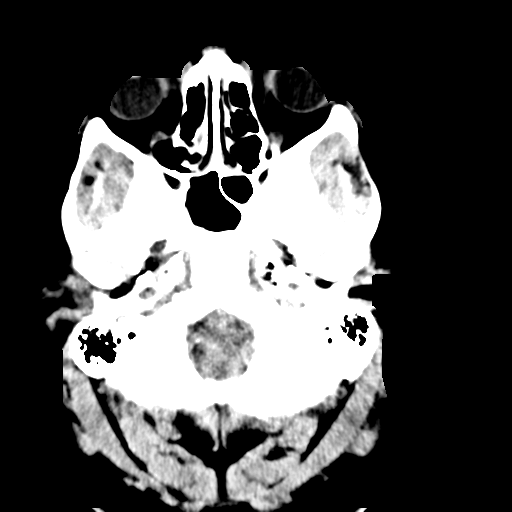
[im 3/30  bone]
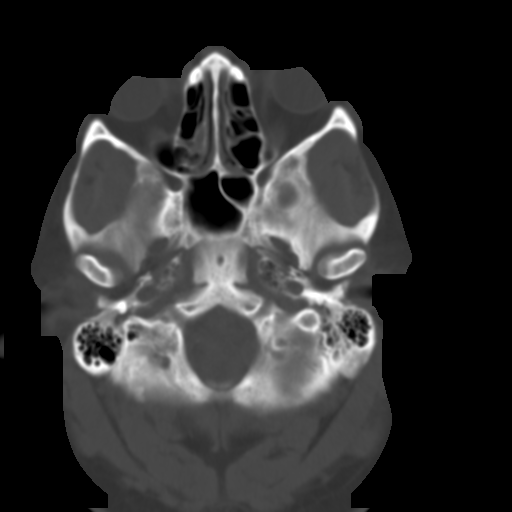
[im 5/30  brain]
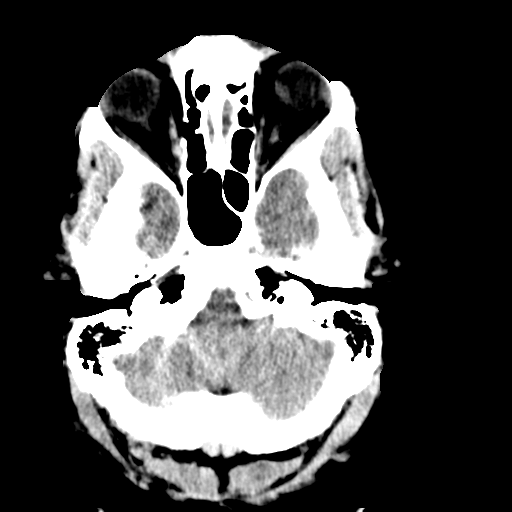
[im 7/30  brain]
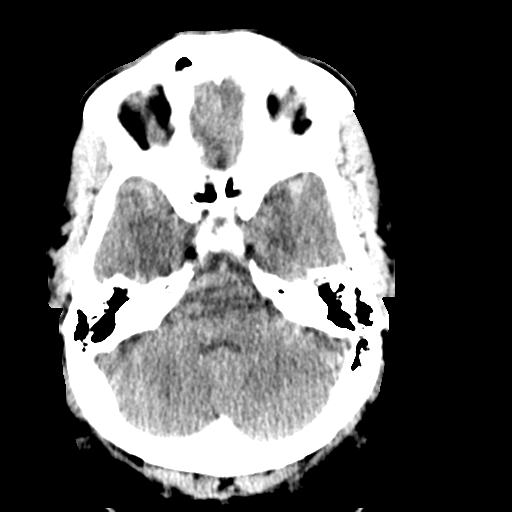
[im 9/30  brain]
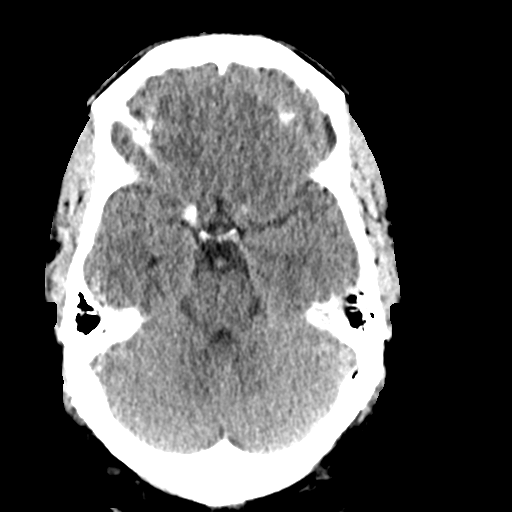
[im 11/30  brain]
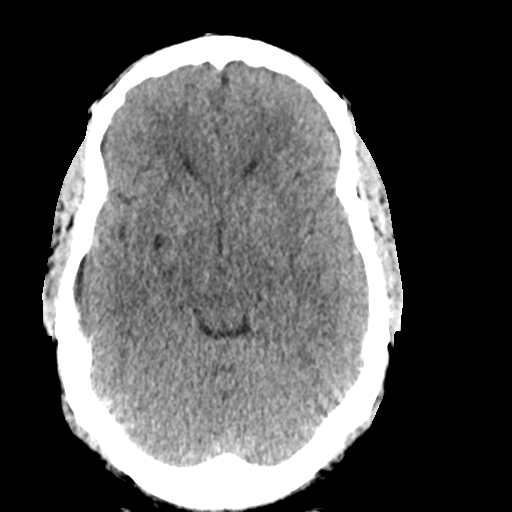
[im 11/30  bone]
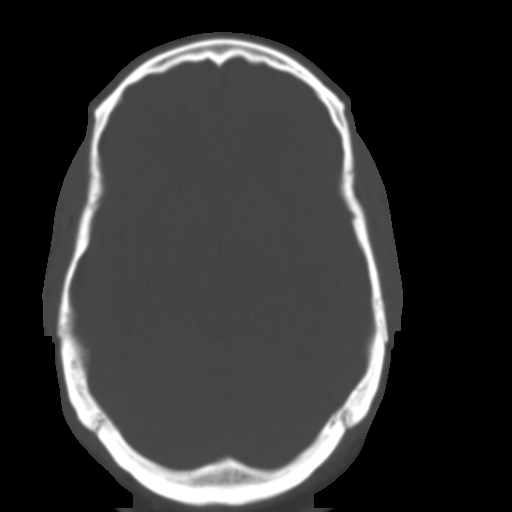
[im 13/30  brain]
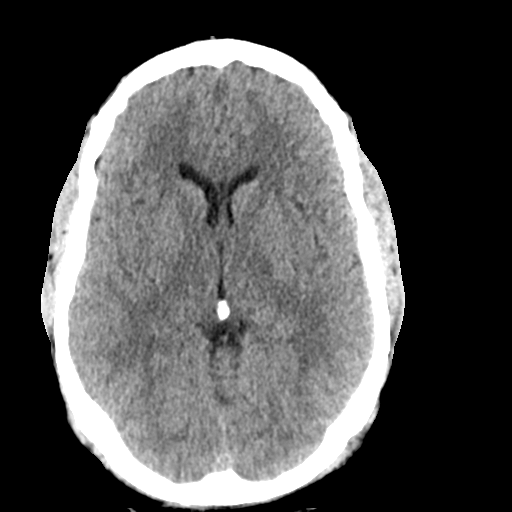
[im 15/30  brain]
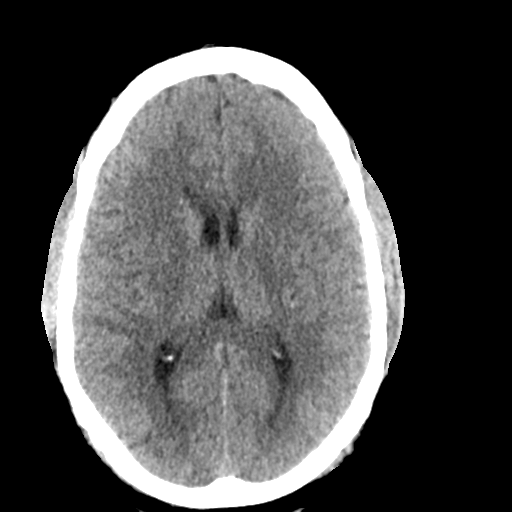
[im 17/30  brain]
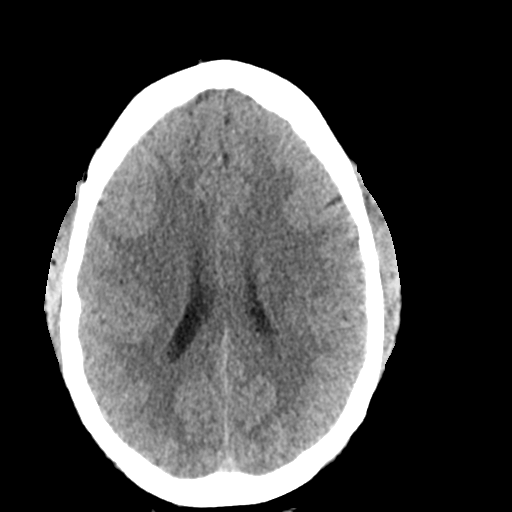
[im 19/30  brain]
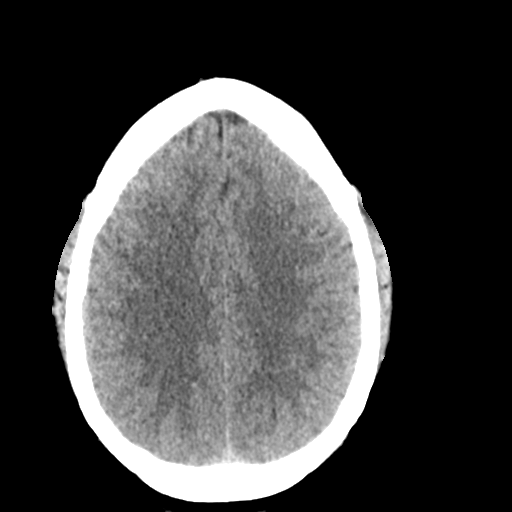
[im 19/30  bone]
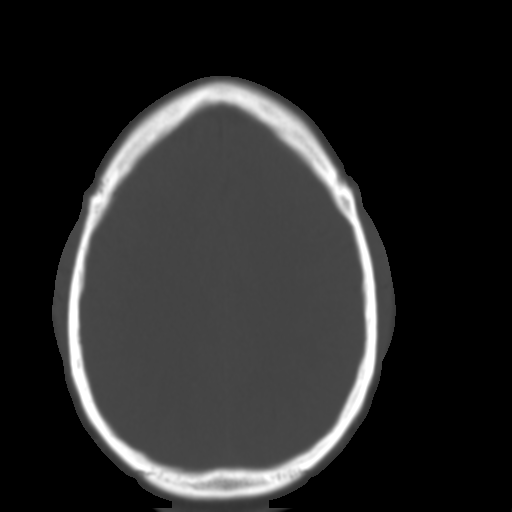
[im 21/30  brain]
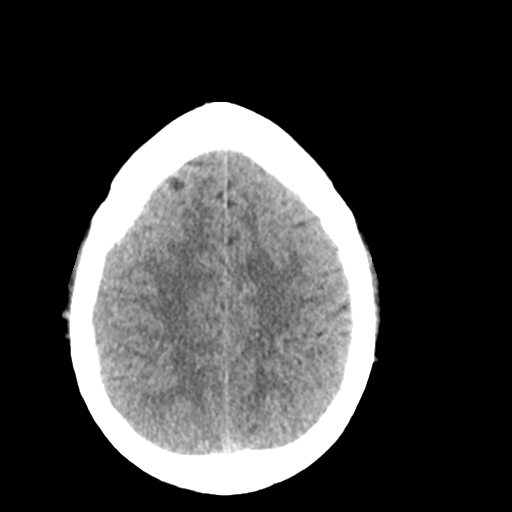
[im 23/30  brain]
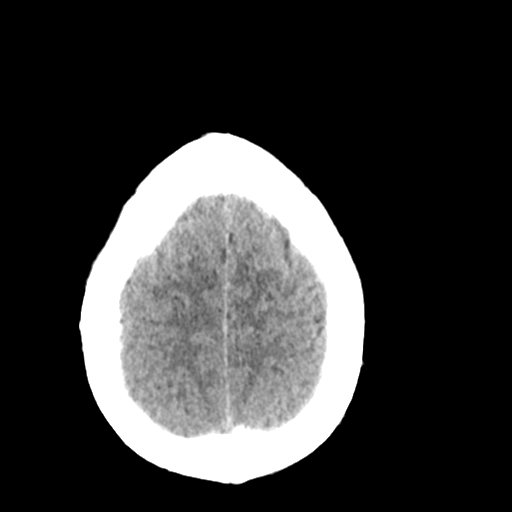
[im 25/30  brain]
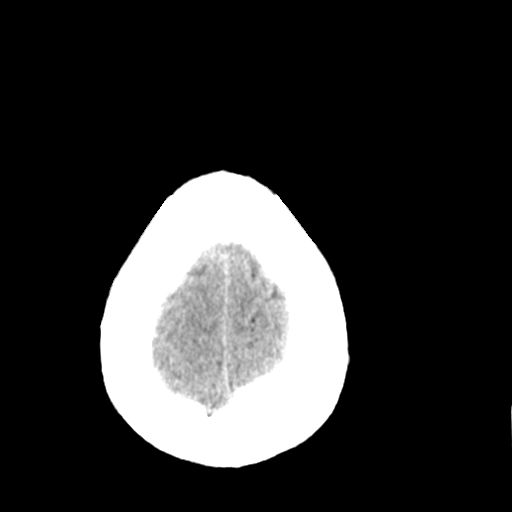
[im 27/30  brain]
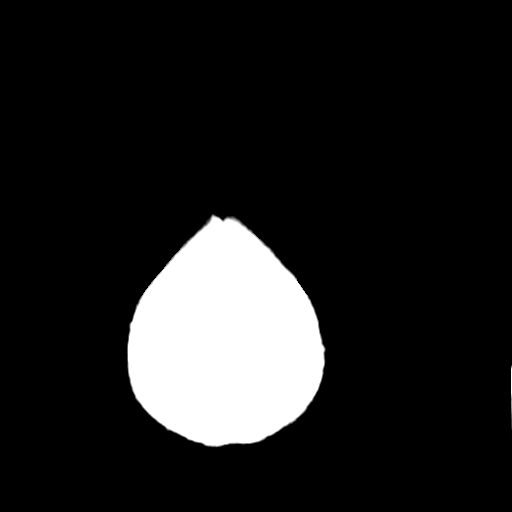
[im 27/30  bone]
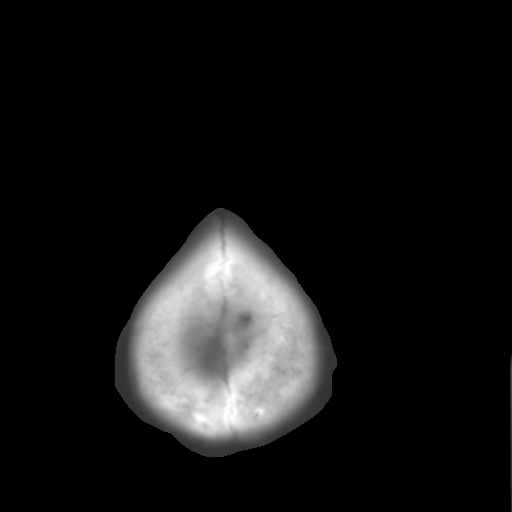

[13 of 30 positions shown; findings below may reference images not displayed]

FINDINGS: The ventricles and sulci are within normal limits for age. There is
no evidence of acute infarct, intracranial hemorrhage, mass, midline
shift, or extra-axial collection. No abnormal enhancement is
identified.

The orbits are unremarkable. The visualized paranasal sinuses and
mastoid air cells are clear. No skull fracture is identified.
IMPRESSION: Unremarkable head CT.

## 2017-01-31 ENCOUNTER — Encounter: Payer: Self-pay | Admitting: *Deleted

## 2017-02-01 ENCOUNTER — Ambulatory Visit
Admission: RE | Admit: 2017-02-01 | Discharge: 2017-02-01 | Disposition: A | Discharge status: Home / Self Care | Payer: BLUE CROSS/BLUE SHIELD | Source: Ambulatory Visit | Attending: Gastroenterology | Admitting: Gastroenterology

## 2017-02-01 ENCOUNTER — Ambulatory Visit: Payer: BLUE CROSS/BLUE SHIELD | Admitting: *Deleted

## 2017-02-01 ENCOUNTER — Encounter: Payer: Self-pay | Admitting: *Deleted

## 2017-02-01 ENCOUNTER — Encounter
Admission: RE | Disposition: A | Discharge status: Home / Self Care | Payer: Self-pay | Source: Ambulatory Visit | Attending: Gastroenterology

## 2017-02-01 DIAGNOSIS — I2510 Atherosclerotic heart disease of native coronary artery without angina pectoris: Secondary | ICD-10-CM | POA: Insufficient documentation

## 2017-02-01 DIAGNOSIS — G4730 Sleep apnea, unspecified: Secondary | ICD-10-CM | POA: Insufficient documentation

## 2017-02-01 DIAGNOSIS — I10 Essential (primary) hypertension: Secondary | ICD-10-CM | POA: Insufficient documentation

## 2017-02-01 DIAGNOSIS — Z7982 Long term (current) use of aspirin: Secondary | ICD-10-CM | POA: Insufficient documentation

## 2017-02-01 DIAGNOSIS — I252 Old myocardial infarction: Secondary | ICD-10-CM | POA: Insufficient documentation

## 2017-02-01 DIAGNOSIS — K5730 Diverticulosis of large intestine without perforation or abscess without bleeding: Secondary | ICD-10-CM | POA: Diagnosis not present

## 2017-02-01 DIAGNOSIS — Z79899 Other long term (current) drug therapy: Secondary | ICD-10-CM | POA: Insufficient documentation

## 2017-02-01 DIAGNOSIS — K219 Gastro-esophageal reflux disease without esophagitis: Secondary | ICD-10-CM | POA: Insufficient documentation

## 2017-02-01 DIAGNOSIS — E785 Hyperlipidemia, unspecified: Secondary | ICD-10-CM | POA: Insufficient documentation

## 2017-02-01 DIAGNOSIS — Z955 Presence of coronary angioplasty implant and graft: Secondary | ICD-10-CM | POA: Insufficient documentation

## 2017-02-01 DIAGNOSIS — Z6837 Body mass index (BMI) 37.0-37.9, adult: Secondary | ICD-10-CM | POA: Diagnosis not present

## 2017-02-01 DIAGNOSIS — K625 Hemorrhage of anus and rectum: Secondary | ICD-10-CM | POA: Diagnosis present

## 2017-02-01 DIAGNOSIS — K648 Other hemorrhoids: Secondary | ICD-10-CM | POA: Insufficient documentation

## 2017-02-01 DIAGNOSIS — Z7902 Long term (current) use of antithrombotics/antiplatelets: Secondary | ICD-10-CM | POA: Insufficient documentation

## 2017-02-01 DIAGNOSIS — E669 Obesity, unspecified: Secondary | ICD-10-CM | POA: Diagnosis not present

## 2017-02-01 HISTORY — DX: Obesity, unspecified: E66.9

## 2017-02-01 HISTORY — DX: Gastro-esophageal reflux disease without esophagitis: K21.9

## 2017-02-01 HISTORY — PX: COLONOSCOPY WITH PROPOFOL: SHX5780

## 2017-02-01 HISTORY — DX: Non-ST elevation (NSTEMI) myocardial infarction: I21.4

## 2017-02-01 HISTORY — DX: Hyperlipidemia, unspecified: E78.5

## 2017-02-01 SURGERY — COLONOSCOPY WITH PROPOFOL
Anesthesia: General

## 2017-02-01 MED ORDER — PROPOFOL 500 MG/50ML IV EMUL
INTRAVENOUS | Status: DC | PRN
  Administered 2017-02-01: 100 ug/kg/min via INTRAVENOUS

## 2017-02-01 MED ORDER — SODIUM CHLORIDE 0.9 % IV SOLN
INTRAVENOUS | Status: DC
Start: 2017-02-01 — End: 2017-02-01
  Administered 2017-02-01: 15:00:00 via INTRAVENOUS

## 2017-02-01 MED ORDER — PROPOFOL 500 MG/50ML IV EMUL
INTRAVENOUS | Status: AC
  Filled 2017-02-01: qty 50

## 2017-02-01 MED ORDER — SODIUM CHLORIDE 0.9 % IV SOLN
INTRAVENOUS | Status: DC
Start: 2017-02-01 — End: 2017-02-01

## 2017-02-01 NOTE — Transfer of Care (Signed)
Immediate Anesthesia Transfer of Care Note  Patient: Carlos Bryant  Procedure(s) Performed: Procedure(s): COLONOSCOPY WITH PROPOFOL (N/A)  Patient Location: PACU  Anesthesia Type:General  Level of Consciousness: awake, alert  and oriented  Airway & Oxygen Therapy: Patient Spontanous Breathing and Patient connected to nasal cannula oxygen  Post-op Assessment: Report given to RN and Post -op Vital signs reviewed and stable  Post vital signs: Reviewed and stable  Last Vitals:  Vitals:   02/01/17 1354  BP: (!) 143/94  Resp: 18  Temp: 36.9 C    Last Pain: There were no vitals filed for this visit.    Patients Stated Pain Goal: 0 (02/01/17 1354)  Complications: No apparent anesthesia complications

## 2017-02-01 NOTE — Anesthesia Preprocedure Evaluation (Signed)
Anesthesia Evaluation  Patient identified by MRN, date of birth, ID band Patient awake    Reviewed: Allergy & Precautions, H&P , NPO status , Patient's Chart, lab work & pertinent test results, reviewed documented beta blocker date and time   History of Anesthesia Complications Negative for: history of anesthetic complications  Airway Mallampati: III  TM Distance: >3 FB Neck ROM: full    Dental  (+) Teeth Intact, Dental Advidsory Given   Pulmonary neg shortness of breath, sleep apnea and Continuous Positive Airway Pressure Ventilation , neg COPD, neg recent URI,           Cardiovascular Exercise Tolerance: Good hypertension, (-) angina+ CAD, + Past MI and + Cardiac Stents  (-) CABG (-) dysrhythmias (-) Valvular Problems/Murmurs     Neuro/Psych negative neurological ROS  negative psych ROS   GI/Hepatic Neg liver ROS, GERD  Controlled,  Endo/Other  negative endocrine ROS  Renal/GU negative Renal ROS  negative genitourinary   Musculoskeletal   Abdominal   Peds  Hematology negative hematology ROS (+)   Anesthesia Other Findings Past Medical History: No date: GERD (gastroesophageal reflux disease) No date: Hyperlipidemia No date: Hypertension No date: NSTEMI (non-ST elevated myocardial infarction)* No date: Obesity   Reproductive/Obstetrics negative OB ROS                             Anesthesia Physical Anesthesia Plan  ASA: III  Anesthesia Plan: General   Post-op Pain Management:    Induction:   Airway Management Planned:   Additional Equipment:   Intra-op Plan:   Post-operative Plan:   Informed Consent: I have reviewed the patients History and Physical, chart, labs and discussed the procedure including the risks, benefits and alternatives for the proposed anesthesia with the patient or authorized representative who has indicated his/her understanding and acceptance.    Dental Advisory Given  Plan Discussed with: Anesthesiologist, CRNA and Surgeon  Anesthesia Plan Comments:         Anesthesia Quick Evaluation

## 2017-02-01 NOTE — H&P (Signed)
Outpatient short stay form Pre-procedure 02/01/2017 2:30 PM Carlos Bryant  Primary Physician: Dr. Bethann PunchesMark Miller  Reason for visit:  Colonoscopy  History of present illness:  Patient is a 46 year old male presenting today as above. He had several episodes of rectal bleeding a couple of months ago. These were painless in nature. He does take dual antiplatelet treatment with Plavix and aspirin is held that for 6 days. He does have a history of coronary artery disease with 2 stents. He tolerated his prep well. He takes no other blood thinning agents or aspirin products. Records indicate that patient takes Brilinta however he states he has not taken that for at least 6 months. He tolerated his prep well.    Current Facility-Administered Medications:  .  0.9 %  sodium chloride infusion, , Intravenous, Continuous, Carlos DeemMartin U Skulskie, Bryant .  0.9 %  sodium chloride infusion, , Intravenous, Continuous, Carlos DeemMartin U Skulskie, Bryant  Prescriptions Prior to Admission  Medication Sig Dispense Refill Last Dose  . aspirin EC 81 MG tablet Take 81 mg by mouth daily.     . cloNIDine (CATAPRES - DOSED IN MG/24 HR) 0.1 mg/24hr patch Place 0.1 patches onto the skin.   02/01/2017 at Unknown time  . cloNIDine (CATAPRES - DOSED IN MG/24 HR) 0.2 mg/24hr patch Place 0.2 mg onto the skin once a week.   02/01/2017 at Unknown time  . clopidogrel (PLAVIX) 75 MG tablet Take 75 mg by mouth daily.   Past Week at Unknown time  . hydrALAZINE (APRESOLINE) 10 MG tablet Take 10 mg by mouth 2 (two) times daily.   02/01/2017 at Unknown time  . Verapamil HCl (VERELAN PO) Take 300 mg by mouth at bedtime.   01/31/2017 at Unknown time  . atorvastatin (LIPITOR) 80 MG tablet Take by mouth.     . carvedilol (COREG) 25 MG tablet Take 50 mg by mouth 2 (two) times daily.      . hydrALAZINE (APRESOLINE) 25 MG tablet Take 10 mg by mouth.      . losartan (COZAAR) 100 MG tablet Take by mouth.     . metoprolol succinate (TOPROL-XL) 25 MG 24 hr tablet Take  50 mg by mouth.      . Multiple Vitamin (MULTI-VITAMINS) TABS Take by mouth.     . nitroGLYCERIN (NITROSTAT) 0.4 MG SL tablet Place under the tongue.     . pantoprazole (PROTONIX) 40 MG tablet Take 40 mg by mouth daily.   Not Taking at Unknown time  . ticagrelor (BRILINTA) 90 MG TABS tablet Take by mouth 2 (two) times daily.     . ticagrelor (BRILINTA) 90 MG TABS tablet Take by mouth.        Allergies  Allergen Reactions  . Amlodipine Other (See Comments)    Ineffective in HTN control     Past Medical History:  Diagnosis Date  . GERD (gastroesophageal reflux disease)   . Hyperlipidemia   . Hypertension   . NSTEMI (non-ST elevated myocardial infarction) (HCC)   . Obesity     Review of systems:      Physical Exam    Heart and lungs: Regular rate and rhythm without rub or gallop, lungs are bilaterally clear.    HEENT: Normocephalic atraumatic eyes are anicteric    Other:     Pertinant exam for procedure: Soft nontender nondistended bowel sounds positive normoactive.    Planned proceedures: Colonoscopy and indicated procedures. I have discussed the risks benefits and complications of procedures to include  not limited to bleeding, infection, perforation and the risk of sedation and the patient wishes to proceed.    Carlos Deem, [facility] 02/01/2017  2:30 PM

## 2017-02-01 NOTE — Op Note (Signed)
[facility] Patient Name: Carlos HellerRichard Townsel Procedure Date: 02/01/2017 10:14 AM MRN: [id number] Account #: [id number] Date of Birth: [birth year] Admit Type: Outpatient Age: [age] [facility] ROOM 1 Gender: Male Note Status: Finalized Procedure:            Colonoscopy Indications:          Rectal bleeding Providers:            Christena DeemMartin U. Skulskie, MD Referring MD:         Danella PentonMark F. Miller, MD (Referring MD) Medicines:            Monitored Anesthesia Care Complications:        No immediate complications. Procedure:            Pre-Anesthesia Assessment:                       - ASA Grade Assessment: III - A patient with severe                        systemic disease.                       After obtaining informed consent, the colonoscope was                        passed under direct vision. Throughout the procedure,                        the patient's blood pressure, pulse, and oxygen                        saturations were monitored continuously. The                        Colonoscope was introduced through the anus and                        advanced to the the cecum, identified by appendiceal                        orifice and ileocecal valve. The colonoscopy was                        performed without difficulty. The patient tolerated the                        procedure well. The quality of the bowel preparation                        was good. Findings:      A few small-mouthed diverticula were found in the sigmoid colon,       descending colon and ascending colon.      Non-bleeding internal hemorrhoids were found during retroflexion and       during anoscopy. The hemorrhoids were small and Grade I (internal       hemorrhoids that do not prolapse).      The exam was otherwise without abnormality.      The digital rectal exam was normal. Impression:           - Diverticulosis in the sigmoid colon, in the  descending colon  and in the ascending colon.                       - Non-bleeding internal hemorrhoids.                       - The examination was otherwise normal.                       - No specimens collected. Recommendation:       - Discharge patient to home.                       - Use Citrucel one tablespoon PO daily daily. Procedure Code(s):    --- Professional ---                       [phone], Colonoscopy, flexible; diagnostic, including                        collection of specimen(s) by brushing or washing, when                        performed (separate procedure) Diagnosis Code(s):    --- Professional ---                       K64.0, First degree hemorrhoids                       K62.5, Hemorrhage of anus and rectum                       K57.30, Diverticulosis of large intestine without                        perforation or abscess without bleeding CPT copyright 2016 [employer]. All rights reserved. The codes documented in this report are preliminary and upon coder review may  be revised to meet current compliance requirements. Christena Deem, MD 02/01/2017 3:00:22 PM This report has been signed electronically. Number of Addenda: 0 Note Initiated On: 02/01/2017 10:14 AM Scope Withdrawal Time: 0 hours 10 minutes 1 second  Total Procedure Duration: 0 hours 15 minutes 39 seconds       [facility]

## 2017-02-01 NOTE — Anesthesia Post-op Follow-up Note (Cosign Needed)
Anesthesia QCDR form completed.        

## 2017-02-02 ENCOUNTER — Encounter: Payer: Self-pay | Admitting: Gastroenterology

## 2017-02-04 NOTE — Anesthesia Postprocedure Evaluation (Signed)
Anesthesia Post Note  Patient: Carlos BurrowRichard E Bryant  Procedure(s) Performed: Procedure(s) (LRB): COLONOSCOPY WITH PROPOFOL (N/A)  Patient location during evaluation: Endoscopy Anesthesia Type: General Level of consciousness: awake and alert Pain management: pain level controlled Vital Signs Assessment: post-procedure vital signs reviewed and stable Respiratory status: spontaneous breathing, nonlabored ventilation, respiratory function stable and patient connected to nasal cannula oxygen Cardiovascular status: blood pressure returned to baseline and stable Postop Assessment: no signs of nausea or vomiting Anesthetic complications: no     Last Vitals:  Vitals:   02/01/17 1520 02/01/17 1530  BP: (!) 122/91 (!) 140/98  Pulse: (!) 58 68  Resp: 20 20  Temp:      Last Pain: There were no vitals filed for this visit.               Lenard SimmerAndrew Sherelle Castelli

## 2021-09-09 ENCOUNTER — Emergency Department (HOSPITAL_COMMUNITY): Payer: BC Managed Care – PPO

## 2021-09-09 ENCOUNTER — Other Ambulatory Visit: Payer: Self-pay

## 2021-09-09 ENCOUNTER — Encounter (HOSPITAL_COMMUNITY): Payer: Self-pay

## 2021-09-09 ENCOUNTER — Emergency Department (HOSPITAL_COMMUNITY)
Admission: EM | Admit: 2021-09-09 | Discharge: 2021-09-09 | Disposition: A | Discharge status: Home / Self Care | Payer: BC Managed Care – PPO | Attending: Emergency Medicine | Admitting: Emergency Medicine

## 2021-09-09 DIAGNOSIS — Z7982 Long term (current) use of aspirin: Secondary | ICD-10-CM | POA: Diagnosis not present

## 2021-09-09 DIAGNOSIS — M25511 Pain in right shoulder: Secondary | ICD-10-CM | POA: Insufficient documentation

## 2021-09-09 DIAGNOSIS — Z79899 Other long term (current) drug therapy: Secondary | ICD-10-CM | POA: Insufficient documentation

## 2021-09-09 DIAGNOSIS — R209 Unspecified disturbances of skin sensation: Secondary | ICD-10-CM | POA: Insufficient documentation

## 2021-09-09 DIAGNOSIS — M79601 Pain in right arm: Secondary | ICD-10-CM | POA: Insufficient documentation

## 2021-09-09 DIAGNOSIS — I10 Essential (primary) hypertension: Secondary | ICD-10-CM | POA: Diagnosis not present

## 2021-09-09 LAB — CBC WITH DIFFERENTIAL/PLATELET
Abs Immature Granulocytes: 0.02 10*3/uL (ref 0.00–0.07)
Basophils Absolute: 0.1 10*3/uL (ref 0.0–0.1)
Basophils Relative: 1 %
Eosinophils Absolute: 0.4 10*3/uL (ref 0.0–0.5)
Eosinophils Relative: 7 %
HCT: 40.4 % (ref 39.0–52.0)
Hemoglobin: 13.4 g/dL (ref 13.0–17.0)
Immature Granulocytes: 0 %
Lymphocytes Relative: 34 %
Lymphs Abs: 2 10*3/uL (ref 0.7–4.0)
MCH: 27.9 pg (ref 26.0–34.0)
MCHC: 33.2 g/dL (ref 30.0–36.0)
MCV: 84 fL (ref 80.0–100.0)
Monocytes Absolute: 0.6 10*3/uL (ref 0.1–1.0)
Monocytes Relative: 11 %
Neutro Abs: 2.8 10*3/uL (ref 1.7–7.7)
Neutrophils Relative %: 47 %
Platelets: 229 10*3/uL (ref 150–400)
RBC: 4.81 MIL/uL (ref 4.22–5.81)
RDW: 13.4 % (ref 11.5–15.5)
WBC: 5.9 10*3/uL (ref 4.0–10.5)
nRBC: 0 % (ref 0.0–0.2)

## 2021-09-09 LAB — COMPREHENSIVE METABOLIC PANEL
ALT: 38 U/L (ref 0–44)
AST: 28 U/L (ref 15–41)
Albumin: 4.2 g/dL (ref 3.5–5.0)
Alkaline Phosphatase: 107 U/L (ref 38–126)
Anion gap: 7 (ref 5–15)
BUN: 13 mg/dL (ref 6–20)
CO2: 26 mmol/L (ref 22–32)
Calcium: 9.7 mg/dL (ref 8.9–10.3)
Chloride: 110 mmol/L (ref 98–111)
Creatinine, Ser: 1.08 mg/dL (ref 0.61–1.24)
GFR, Estimated: >60 mL/min (ref >60)
Glucose, Bld: 113 mg/dL — ABNORMAL HIGH (ref 70–99)
Potassium: 4.4 mmol/L (ref 3.5–5.1)
Sodium: 143 mmol/L (ref 135–145)
Total Bilirubin: 1.1 mg/dL (ref 0.3–1.2)
Total Protein: 7.7 g/dL (ref 6.5–8.1)

## 2021-09-09 LAB — TROPONIN I (HIGH SENSITIVITY)
Troponin I (High Sensitivity): 5 ng/L (ref <18)
Troponin I (High Sensitivity): 6 ng/L (ref <18)

## 2021-09-09 LAB — LIPASE, BLOOD: Lipase: 71 U/L — ABNORMAL HIGH (ref 11–51)

## 2021-09-09 MED ORDER — KETOROLAC TROMETHAMINE 60 MG/2ML IM SOLN
30.0000 mg | Freq: Once | INTRAMUSCULAR | Status: AC
  Administered 2021-09-09: 30 mg via INTRAMUSCULAR
  Filled 2021-09-09: qty 2

## 2021-09-09 MED ORDER — LIDOCAINE 5 % EX OINT: 1.0000 "application " | TOPICAL_OINTMENT | CUTANEOUS | 0 refills | Status: AC | PRN

## 2021-09-09 MED ORDER — METHOCARBAMOL 500 MG PO TABS: 500.0000 mg | ORAL_TABLET | Freq: Two times a day (BID) | ORAL | 0 refills | Status: AC | PRN

## 2021-09-09 NOTE — ED Triage Notes (Signed)
Per EMS-states intermittent B/L shoulder pain on and off for weeks-states right shoulder radiating down right arm-150 mcg of Fentanyl given in route-history of MI ans stent placement in 2018

## 2021-09-09 NOTE — ED Notes (Signed)
An After Visit Summary was printed and given to the patient. Discharge instructions given and no further questions at this time.  

## 2021-09-09 NOTE — ED Provider Notes (Signed)
Emergency Medicine Provider Triage Evaluation Note  Carlos Bryant , a 50 y.o. male  was evaluated in triage.  Pt complains of pain in his bilateral shoulders intermittent for weeks.  Pain has been constant since last night, has been present before but not as bad.  Right hand feels tingly.    Elevating right arm makes it better.    He reports that when he had a heart attack he had shortness of breath and chest pain,  He reports that when he had his MI he had right arm pain, not left arm pain.   Compliant with medications.   Right arm BP 167/117, left arm 172/114. Has been slightly diaphoretic since getting fentanyl   Review of Systems  Positive: Right arm pain,  back pain Negative: Chest pain, syncope, N/V  Physical Exam  There were no vitals taken for this visit. Gen:   Awake, mildly diaphoretic Resp:  Normal effort  MSK:   Moves extremities without difficulty  Other:  Decree sensation to light touch in right hand and right forearm.  2+ right radial pulse.    Medical Decision Making  Medically screening exam initiated at 2:06 PM.  Appropriate orders placed.  Carlos Bryant was informed that the remainder of the evaluation will be completed by another provider, this initial triage assessment does not replace that evaluation, and the importance of remaining in the ED until their evaluation is complete.  Note: Portions of this report may have been transcribed using voice recognition software. Every effort was made to ensure accuracy; however, inadvertent computerized transcription errors may be present    Carlos Bryant 09/09/21 1901    Mancel Bale, MD 09/14/21 2046

## 2021-09-09 NOTE — ED Provider Notes (Signed)
[facility]-EMERGENCY DEPT Provider Note   CSN: [id number] Arrival date & time: 09/09/21  1355     History Chief Complaint  Patient presents with   Shoulder Pain    Carlos Bryant is a 50 y.o. male presents to the emergency department for evaluation of intermittent shoulder blade pain for the past 2 months.  Patient reports the pain was initially in his left shoulder blade, but moved over to his right shoulder blade for the past 3 weeks.  Today, he reports the pain radiated down his right arm with a tingling sensation in his hand.  He denies any weakness to the area.  He finds relief with elevation of his arm above his head, and the pain is exacerbated with palpation.  He denies any chest pain, shortness of breath, diaphoresis, nausea, vomiting, URI symptoms, abdominal pain.  Denies any falls, traumas, MVC's to the area.  Denies any recent new exercises or occupations.  Medical history includes hypertension and NSTEMI in 2016.  Surgical history includes 2 cardiac stents.  Medications include verapamil, hydralazine, carvedilol, clonidine, and atorvastatin.  No known drug allergies.  Patient denies any tobacco, EtOH, or drug use.   Shoulder Pain Associated symptoms: no back pain and no fever       Past Medical History:  Diagnosis Date   GERD (gastroesophageal reflux disease)    Hyperlipidemia    Hypertension    NSTEMI (non-ST elevated myocardial infarction) (HCC)    Obesity     There are no problems to display for this patient.   Past Surgical History:  Procedure Laterality Date   CARDIAC CATHETERIZATION     COLONOSCOPY WITH PROPOFOL N/A 02/01/2017   Procedure: COLONOSCOPY WITH PROPOFOL;  Surgeon: Christena Deem, MD;  Location: [facility] ENDOSCOPY;  Service: Endoscopy;  Laterality: N/A;       No family history on file.  Social History   Tobacco Use   Smoking status: Never   Smokeless tobacco: Never  Vaping Use   Vaping Use: Never used  Substance  Use Topics   Alcohol use: No   Drug use: No    Home Medications Prior to Admission medications   Medication Sig Start Date End Date Taking? Authorizing Provider  aspirin EC 81 MG tablet Take 81 mg by mouth daily.   Yes [provider]  atorvastatin (LIPITOR) 80 MG tablet Take 80 mg by mouth at bedtime. 04/15/15 10/10/21 Yes [provider]  carvedilol (COREG) 25 MG tablet Take 50 mg by mouth 2 (two) times daily.  04/15/15 10/10/21 Yes [provider]  cloNIDine (CATAPRES) 0.1 MG tablet Take 0.1 mg by mouth 2 (two) times daily.   Yes [provider]  hydrALAZINE (APRESOLINE) 25 MG tablet Take 25 mg by mouth 2 (two) times daily. 05/06/15 10/10/21 Yes [provider]  lidocaine (XYLOCAINE) 5 % ointment Apply 1 application topically as needed. 09/09/21  Yes Achille Rich, PA-C  methocarbamol (ROBAXIN) 500 MG tablet Take 1 tablet (500 mg total) by mouth 2 (two) times daily as needed for muscle spasms. 09/09/21  Yes Achille Rich, PA-C  Multiple Vitamin (MULTI-VITAMINS) TABS Take 1 tablet by mouth daily with breakfast.   Yes [provider]  nitroGLYCERIN (NITROSTAT) 0.4 MG SL tablet Place 0.4 mg under the tongue every 5 (five) minutes as needed for chest pain. 01/17/15 10/10/21 Yes [provider]  TURMERIC PO Take 1 capsule by mouth daily with breakfast.   Yes [provider]  verapamil (CALAN-SR) 240 MG  CR tablet Take 240 mg by mouth at bedtime.   Yes [provider]    Allergies    Amlodipine  Review of Systems   Review of Systems  Constitutional:  Negative for chills and fever.  HENT:  Negative for ear pain and sore throat.   Eyes:  Negative for pain and visual disturbance.  Respiratory:  Negative for cough and shortness of breath.   Cardiovascular:  Negative for chest pain and palpitations.  Gastrointestinal:  Negative for abdominal pain and vomiting.  Genitourinary:  Negative for dysuria and hematuria.   Musculoskeletal:  Positive for arthralgias. Negative for back pain and joint swelling.  Skin:  Negative for color change and rash.  Neurological:  Negative for seizures, syncope, weakness and numbness.  All other systems reviewed and are negative.  Physical Exam Updated Vital Signs BP (!) 151/97   Pulse 70   Temp 99.1 F (37.3 C) (Oral)   Resp 18   Ht 6' (1.829 m)   Wt 129.3 kg   SpO2 99%   BMI 38.65 kg/m   Physical Exam Vitals and nursing note reviewed.  Constitutional:      Appearance: Normal appearance.  HENT:     Head: Normocephalic and atraumatic.  Eyes:     General: No scleral icterus. Cardiovascular:     Rate and Rhythm: Normal rate and regular rhythm.  Pulmonary:     Effort: Pulmonary effort is normal. No respiratory distress.     Breath sounds: Normal breath sounds. No wheezing.  Chest:     Chest wall: No tenderness.  Abdominal:     General: Abdomen is flat. Bowel sounds are normal.     Palpations: Abdomen is soft.     Tenderness: There is no abdominal tenderness. There is no guarding or rebound.  Musculoskeletal:        General: No swelling or deformity. Normal range of motion.     Cervical back: Normal range of motion.     Comments: Palpable muscle spasm to the superior portion of the right shoulder blade.  No obvious deformities or step-offs palpated.  Tenderness palpation of the the radial nerve in the spiral groove that elicits the tingling sensation in hand.  No overlying skin changes to the right shoulder blade or right upper extremity noted.  No rashes, erythema, or ecchymosis noted.  Patient has full and equal strength bilaterally in upper and lower extremities.  He has no tenderness palpation in the joint of the shoulder or upper deltoid, it is just to the superior right shoulder blade.  No midline C spine, thoracic, lumbar, or sacral tenderness palpation.  No paraspinal tenderness.  Full range of motion of neck.  Radial pulses intact.  Sensation intact.   Compartments soft.  Skin:    General: Skin is warm and dry.     Capillary Refill: Capillary refill takes less than 2 seconds.  Neurological:     General: No focal deficit present.     Mental Status: He is alert. Mental status is at baseline.     Sensory: No sensory deficit.     Motor: No weakness.     Gait: Gait normal.    ED Results / Procedures / Treatments   Labs (all labs ordered are listed, but only abnormal results are displayed) Labs Reviewed  COMPREHENSIVE METABOLIC PANEL - Abnormal; Notable for the following components:      Result Value   Glucose, Bld 113 (*)    All other components within normal limits  LIPASE, BLOOD - Abnormal; Notable for the following components:   Lipase 71 (*)    All other components within normal limits  CBC WITH DIFFERENTIAL/PLATELET  TROPONIN I (HIGH SENSITIVITY)  TROPONIN I (HIGH SENSITIVITY)    EKG EKG Interpretation  Date/Time:  Wednesday September 09 2021 14:24:08 EDT Ventricular Rate:  63 PR Interval:  163 QRS Duration: 105 QT Interval:  413 QTC Calculation: 423 R Axis:   -24 Text Interpretation: Sinus rhythm Consider left atrial enlargement Left ventricular hypertrophy Borderline T abnormalities, inferior leads Confirmed by Ernie Avena ([id number]) on 09/09/2021 7:39:53 PM  Radiology DG Chest 2 View  Result Date: 09/09/2021 CLINICAL DATA:  Chest pain. EXAM: CHEST - 2 VIEW COMPARISON:  January 15, 2015. FINDINGS: The heart size and mediastinal contours are within normal limits. Both lungs are clear. The visualized skeletal structures are unremarkable. IMPRESSION: No active cardiopulmonary disease. Electronically Signed   By: Lupita Raider M.D.   On: 09/09/2021 15:48    Procedures Procedures   Medications Ordered in ED Medications  ketorolac (TORADOL) injection 30 mg (30 mg Intramuscular Given 09/09/21 2033)    ED Course  I have reviewed the triage vital signs and the nursing notes.  Pertinent labs & imaging results that  were available during my care of the patient were reviewed by me and considered in my medical decision making (see chart for details).  Jaelan Donell Sievert presents for evaluation of right shoulder blade pain.  This includes was not limited to muscle strain, muscle sprain, MSK injury, ACS, cholecystitis, cholelithiasis, cholangitis, pancreatitis.  I personally reviewed and interpreted the patient's labs and imaging.  CBC benign.  No leukocytosis or anemia noted.  CMP shows mildly elevated glucose at 113, otherwise no electrolyte abnormalities.  Troponin initial 5 on repeat 6.  Lipase slightly elevated at 71.  EKG shows sinus rhythm, and borderline T abnormalities.  No sign of STEMI.  Chest x-ray shows no active cardiopulmonary disease.  Due to benign labs and length of duration of symptoms, other than slightly elevated lipase, this is less likely cholecystitis, cholelithiasis, cholangitis, or pancreatitis.  Patient reports his right shoulder blade pain is exacerbated after my exam.  Patient given Toradol for pain.  Due to benign labs and imaging, this is most likely MSK in nature.  The patient is following up with his primary care doctor on 10/17.  I recommended he keep that appointment and discuss this shoulder pain with him as he might be able to refer him to PT/OT or handle the management further.  Additionally, I referred him to orthopedics as well.  Suggested he apply heat and/or ice to the area as needed.  He can take Tylenol or ibuprofen for the pain.  Prescribed him topical lidocaine and Robaxin for pain as needed.  Turn precautions given.  Patient agrees to plan.  Patient is stable be discharged home in good condition.   MDM Rules/Calculators/A&P                          Final Clinical Impression(s) / ED Diagnoses Final diagnoses:  Acute pain of right shoulder    Rx / DC Orders ED Discharge Orders          Ordered    lidocaine (XYLOCAINE) 5 % ointment  As needed        09/09/21 2042     methocarbamol (ROBAXIN) 500 MG tablet  2 times daily PRN  09/09/21 2044             Achille Rich, PA-C 09/10/21 0206    Ernie Avena, MD 09/10/21 1058

## 2021-09-09 NOTE — Discharge Instructions (Addendum)
You are seen here today for worsening right shoulder pain.  Your x-rays and labs were benign.  This is most likely musculoskeletal in nature.  You been prescribed Robaxin which is a muscle relaxant that you can take up to twice daily as needed.  Do not drive or operate heavy machinery while on this medication. Additionally, I prescribed you topical lidocaine that you can apply to the affected areas.  Make sure you are wearing gloves when applying this medication as it can make your hand numb as well.  Please follow-up with your scheduled primary care provider appointment later this week and mention this chronic problem you have been having.  Additionally, I have included a referral to orthopedic practice.  Please call and schedule an appointment.  Have any symptoms such as chest pain, shortness of breath, inability move your arm, swelling, limb temperature differences, please return to the nearest emergency department.  If you have any new or worsening symptoms, or any concern, please return to the nearest emergency department.  Work note included if you need it.

## 2021-09-25 ENCOUNTER — Other Ambulatory Visit (HOSPITAL_COMMUNITY): Payer: Self-pay | Admitting: Internal Medicine

## 2021-09-25 ENCOUNTER — Other Ambulatory Visit: Payer: Self-pay | Admitting: Internal Medicine

## 2021-09-25 DIAGNOSIS — M5010 Cervical disc disorder with radiculopathy, unspecified cervical region: Secondary | ICD-10-CM

## 2021-10-04 ENCOUNTER — Ambulatory Visit
Admission: RE | Admit: 2021-10-04 | Discharge: 2021-10-04 | Disposition: A | Discharge status: Home / Self Care | Payer: BC Managed Care – PPO | Source: Ambulatory Visit | Attending: Internal Medicine | Admitting: Internal Medicine

## 2021-10-04 ENCOUNTER — Other Ambulatory Visit: Payer: Self-pay

## 2021-10-04 DIAGNOSIS — M5010 Cervical disc disorder with radiculopathy, unspecified cervical region: Secondary | ICD-10-CM | POA: Diagnosis present

## 2022-06-07 ENCOUNTER — Other Ambulatory Visit: Payer: Self-pay | Admitting: Internal Medicine

## 2022-06-07 DIAGNOSIS — G244 Idiopathic orofacial dystonia: Secondary | ICD-10-CM

## 2022-06-12 ENCOUNTER — Ambulatory Visit
Admission: RE | Admit: 2022-06-12 | Discharge: 2022-06-12 | Disposition: A | Discharge status: Home / Self Care | Payer: BC Managed Care – PPO | Source: Ambulatory Visit | Attending: Internal Medicine | Admitting: Internal Medicine

## 2022-06-12 DIAGNOSIS — G244 Idiopathic orofacial dystonia: Secondary | ICD-10-CM | POA: Diagnosis present

## 2022-06-12 MED ORDER — GADOBUTROL 1 MMOL/ML IV SOLN
10.0000 mL | Freq: Once | INTRAVENOUS | Status: AC | PRN
  Administered 2022-06-12: 10 mL via INTRAVENOUS
